# Patient Record
Sex: Male | Born: 1960
Health system: Southern US, Community
[De-identification: ages and names within clinical notes are randomized; demographics above are authoritative.]

## PROBLEM LIST (undated history)

## (undated) DIAGNOSIS — I10 Essential (primary) hypertension: Secondary | ICD-10-CM

## (undated) DIAGNOSIS — K5790 Diverticulosis of intestine, part unspecified, without perforation or abscess without bleeding: Secondary | ICD-10-CM

## (undated) DIAGNOSIS — E785 Hyperlipidemia, unspecified: Secondary | ICD-10-CM

## (undated) DIAGNOSIS — K219 Gastro-esophageal reflux disease without esophagitis: Secondary | ICD-10-CM

## (undated) DIAGNOSIS — I2510 Atherosclerotic heart disease of native coronary artery without angina pectoris: Secondary | ICD-10-CM

## (undated) DIAGNOSIS — I214 Non-ST elevation (NSTEMI) myocardial infarction: Secondary | ICD-10-CM

## (undated) DIAGNOSIS — F17201 Nicotine dependence, unspecified, in remission: Secondary | ICD-10-CM

## (undated) HISTORY — DX: Essential (primary) hypertension: I10

## (undated) HISTORY — DX: Nicotine dependence, unspecified, in remission: F17.201

## (undated) HISTORY — DX: Atherosclerotic heart disease of native coronary artery without angina pectoris: I25.10

## (undated) HISTORY — DX: Hyperlipidemia, unspecified: E78.5

## (undated) HISTORY — PX: HEMORROIDECTOMY: SUR656

## (undated) HISTORY — DX: Non-ST elevation (NSTEMI) myocardial infarction: I21.4

## (undated) HISTORY — DX: Gastro-esophageal reflux disease without esophagitis: K21.9

---

## 1998-02-22 ENCOUNTER — Emergency Department (HOSPITAL_COMMUNITY): Admission: EM | Admit: 1998-02-22 | Discharge: 1998-02-22 | Payer: Self-pay | Admitting: Emergency Medicine

## 2010-09-07 HISTORY — PX: COLONOSCOPY: SHX174

## 2011-02-01 ENCOUNTER — Emergency Department (HOSPITAL_COMMUNITY): Payer: BC Managed Care – PPO

## 2011-02-01 ENCOUNTER — Inpatient Hospital Stay (HOSPITAL_COMMUNITY)
Admission: EM | Admit: 2011-02-01 | Discharge: 2011-02-05 | DRG: 808 | Disposition: A | Discharge status: Home / Self Care | Payer: BC Managed Care – PPO | Attending: Cardiology | Admitting: Cardiology

## 2011-02-01 DIAGNOSIS — I214 Non-ST elevation (NSTEMI) myocardial infarction: Principal | ICD-10-CM | POA: Diagnosis present

## 2011-02-01 DIAGNOSIS — I2510 Atherosclerotic heart disease of native coronary artery without angina pectoris: Secondary | ICD-10-CM | POA: Diagnosis present

## 2011-02-01 DIAGNOSIS — E785 Hyperlipidemia, unspecified: Secondary | ICD-10-CM | POA: Diagnosis present

## 2011-02-01 DIAGNOSIS — K219 Gastro-esophageal reflux disease without esophagitis: Secondary | ICD-10-CM | POA: Diagnosis present

## 2011-02-01 DIAGNOSIS — Z7902 Long term (current) use of antithrombotics/antiplatelets: Secondary | ICD-10-CM

## 2011-02-01 DIAGNOSIS — I2582 Chronic total occlusion of coronary artery: Secondary | ICD-10-CM | POA: Diagnosis present

## 2011-02-01 DIAGNOSIS — Z7982 Long term (current) use of aspirin: Secondary | ICD-10-CM

## 2011-02-01 DIAGNOSIS — I10 Essential (primary) hypertension: Secondary | ICD-10-CM | POA: Diagnosis present

## 2011-02-01 DIAGNOSIS — E7800 Pure hypercholesterolemia, unspecified: Secondary | ICD-10-CM | POA: Diagnosis present

## 2011-02-01 DIAGNOSIS — F17200 Nicotine dependence, unspecified, uncomplicated: Secondary | ICD-10-CM | POA: Diagnosis present

## 2011-02-01 DIAGNOSIS — F1210 Cannabis abuse, uncomplicated: Secondary | ICD-10-CM | POA: Diagnosis present

## 2011-02-01 LAB — COMPREHENSIVE METABOLIC PANEL
ALT: 41 U/L (ref 0–53)
Alkaline Phosphatase: 72 U/L (ref 39–117)
BUN: 8 mg/dL (ref 6–23)
CO2: 25 mEq/L (ref 19–32)
Chloride: 100 mEq/L (ref 96–112)
GFR calc non Af Amer: >60 mL/min (ref >60)
Glucose, Bld: 112 mg/dL — ABNORMAL HIGH (ref 70–99)
Potassium: 4.3 mEq/L (ref 3.5–5.1)
Sodium: 136 mEq/L (ref 135–145)
Total Bilirubin: 0.2 mg/dL — ABNORMAL LOW (ref 0.3–1.2)
Total Protein: 7 g/dL (ref 6.0–8.3)

## 2011-02-01 LAB — CBC
HCT: 45.5 % (ref 39.0–52.0)
Hemoglobin: 15.7 g/dL (ref 13.0–17.0)
MCH: 30 pg (ref 26.0–34.0)
MCV: 87 fL (ref 78.0–100.0)
RBC: 5.23 MIL/uL (ref 4.22–5.81)
WBC: 12.7 10*3/uL — ABNORMAL HIGH (ref 4.0–10.5)

## 2011-02-01 LAB — CK TOTAL AND CKMB (NOT AT ARMC): Total CK: 1151 U/L — ABNORMAL HIGH (ref 7–232)

## 2011-02-01 LAB — TSH: TSH: 1.838 u[IU]/mL (ref 0.350–4.500)

## 2011-02-01 LAB — TROPONIN I: Troponin I: 10.71 ng/mL (ref <0.30)

## 2011-02-01 LAB — MRSA PCR SCREENING: MRSA by PCR: NEGATIVE

## 2011-02-01 LAB — PROTIME-INR: Prothrombin Time: 13.8 seconds (ref 11.6–15.2)

## 2011-02-01 LAB — CK: Total CK: 785 U/L — ABNORMAL HIGH (ref 7–232)

## 2011-02-01 LAB — CARDIAC PANEL(CRET KIN+CKTOT+MB+TROPI): CK, MB: 117.6 ng/mL (ref 0.3–4.0)

## 2011-02-01 LAB — HEMOGLOBIN A1C: Mean Plasma Glucose: 111 mg/dL (ref <117)

## 2011-02-02 LAB — CARDIAC PANEL(CRET KIN+CKTOT+MB+TROPI)
CK, MB: 81.9 ng/mL (ref 0.3–4.0)
Relative Index: 7.8 — ABNORMAL HIGH (ref 0.0–2.5)
Troponin I: 21.12 ng/mL (ref <0.30)

## 2011-02-02 LAB — CBC
HCT: 40.8 % (ref 39.0–52.0)
HCT: 41.3 % (ref 39.0–52.0)
Hemoglobin: 14.1 g/dL (ref 13.0–17.0)
MCH: 30.3 pg (ref 26.0–34.0)
MCV: 87.1 fL (ref 78.0–100.0)
RBC: 4.65 MIL/uL (ref 4.22–5.81)
RBC: 4.74 MIL/uL (ref 4.22–5.81)
WBC: 12.6 10*3/uL — ABNORMAL HIGH (ref 4.0–10.5)

## 2011-02-02 LAB — LIPID PANEL: LDL Cholesterol: 118 mg/dL — ABNORMAL HIGH (ref 0–99)

## 2011-02-02 LAB — HEPARIN LEVEL (UNFRACTIONATED): Heparin Unfractionated: 0.29 IU/mL — ABNORMAL LOW (ref 0.30–0.70)

## 2011-02-03 DIAGNOSIS — I2510 Atherosclerotic heart disease of native coronary artery without angina pectoris: Secondary | ICD-10-CM

## 2011-02-03 HISTORY — PX: CORONARY ANGIOPLASTY WITH STENT PLACEMENT: SHX49

## 2011-02-03 HISTORY — PX: CARDIAC CATHETERIZATION: SHX172

## 2011-02-03 LAB — CBC
HCT: 39.9 % (ref 39.0–52.0)
Hemoglobin: 13.6 g/dL (ref 13.0–17.0)
MCH: 30 pg (ref 26.0–34.0)
MCV: 88.1 fL (ref 78.0–100.0)
RBC: 4.53 MIL/uL (ref 4.22–5.81)

## 2011-02-03 LAB — BASIC METABOLIC PANEL
BUN: 10 mg/dL (ref 6–23)
GFR calc Af Amer: >60 mL/min (ref >60)
GFR calc non Af Amer: >60 mL/min (ref >60)
Potassium: 3.9 mEq/L (ref 3.5–5.1)
Sodium: 136 mEq/L (ref 135–145)

## 2011-02-03 LAB — CARDIAC PANEL(CRET KIN+CKTOT+MB+TROPI)
CK, MB: 10.2 ng/mL (ref 0.3–4.0)
Relative Index: 3.7 — ABNORMAL HIGH (ref 0.0–2.5)

## 2011-02-04 LAB — CBC
MCH: 30.3 pg (ref 26.0–34.0)
MCHC: 34.6 g/dL (ref 30.0–36.0)
MCV: 87.5 fL (ref 78.0–100.0)
Platelets: 296 10*3/uL (ref 150–400)
RBC: 5.05 MIL/uL (ref 4.22–5.81)
RDW: 13 % (ref 11.5–15.5)

## 2011-02-04 LAB — BASIC METABOLIC PANEL
BUN: 9 mg/dL (ref 6–23)
Creatinine, Ser: 0.96 mg/dL (ref 0.4–1.5)
GFR calc non Af Amer: >60 mL/min (ref >60)
Glucose, Bld: 95 mg/dL (ref 70–99)
Potassium: 4 mEq/L (ref 3.5–5.1)

## 2011-02-04 LAB — HEPARIN LEVEL (UNFRACTIONATED): Heparin Unfractionated: 0.18 IU/mL — ABNORMAL LOW (ref 0.30–0.70)

## 2011-02-05 LAB — BASIC METABOLIC PANEL
BUN: 14 mg/dL (ref 6–23)
CO2: 27 mEq/L (ref 19–32)
Calcium: 9.8 mg/dL (ref 8.4–10.5)
Chloride: 99 mEq/L (ref 96–112)
Creatinine, Ser: 1.02 mg/dL (ref 0.4–1.5)

## 2011-02-05 LAB — HEPARIN LEVEL (UNFRACTIONATED): Heparin Unfractionated: <0.1 IU/mL — ABNORMAL LOW (ref 0.30–0.70)

## 2011-02-05 NOTE — H&P (Signed)
NAME:  Robert Dyer, Robert Dyer NO.:  [id number]  MEDICAL RECORD NO.:  [id number]           PATIENT TYPE:  I  LOCATION:  2920                         FACILITY:  [facility]  PHYSICIAN:  Cassell Clement, M.D. DATE OF BIRTH:  09/11/1960  DATE OF ADMISSION:  02/01/2011 DATE OF DISCHARGE:                             HISTORY & PHYSICAL   PRIMARY CARDIOLOGIST:  Cassell Clement, MD  PRIMARY CARE PHYSICIAN:  Dr. Sharyn Lull at [facility].  REASON FOR ADMISSION:  Non-ST elevated MI.  HISTORY OF PRESENT ILLNESS:  This is a 50 year old Caucasian male without prior cardiac history with complaints of midsternal chest pain starting 3 days ago which he described as indigestion and tightness lasting 3 hours, coming and going.  He took Tums and the pain was relieved.  The pain reoccurred with exertion, replacing a boat motor pump.  The following day, he had recurrence of chest pain after eating, lying down.  He took some Tums again, lasting on and off all night long from about 10:00 p.m. to 7:00 a.m. and then went away on its own.  Again last night prior to admission, he had recurrence of chest pain after eating.  He again felt it was indigestion.  The symptom was associated with eating food.  He states that the pain was more intense and was constant with no associated nausea, shortness breath, or diaphoresis during any chest pain occurrences.  He took Tums, which did not help.  Since the pain was recurrent, worsening, and did not go away, he came to the emergency room where he was treated with aspirin, nitroglycerin, and cardiac enzymes were cycled.  Initial cardiac enzyme was found to be positive with a troponin of 2.93 with a CK of 785.  He was subsequently started on heparin drip and nitroglycerin drip and is resting comfortably.  He was given a low dose of morphine and is without complaint at this time.  As a result of this, we are asked to see him. The patient did have  an EKG.  It did show some anterolateral Q-waves noted, which uncertain of age.  The patient has multiple cardiovascular risk factors.  REVIEW OF SYSTEMS:  Chest pain, indigestion progressive, nonradiating with no associated shortness of breath, dizziness, nausea, vomiting, or diaphoresis.  All other systems are reviewed and found to be negative. Code status is full.  PAST MEDICAL HISTORY:  GERD, hypertension, hypercholesterolemia. A.  Allergic to statins causing hives.  PAST SURGICAL HISTORY:  Hemorrhoidectomy, repair of nose fracture, hand and leg fractures.  SOCIAL HISTORY:  He lives in [place] with his wife.  He works at [employer].  He is married, is a 25 pack-year smoker, occasional EtOH, occasionally use marijuana.  FAMILY HISTORY:  Mother deceased from MI at age 13.  Father deceased from old days with liver disease and kidney disease.  He has 1 sister, who has had an MI and some blood clots in the past.  CURRENT MEDICATIONS:  Prior to admission, 1. Lisinopril 5 mg daily. 2. Goody's Powder p.r.n.  ALLERGIES:  TO STATINS CAUSING HIVES.  LABORATORY DATA:  Current labs, sodium 136,  potassium 4.3, chloride 100, CO2 of 25, BUN 8, creatinine 1.01, glucose 112.  Hemoglobin 15.7, hematocrit 45.5, white blood cells 12.7, platelets 293.  Total bili 0.2, alkaline phosphatase 72, AST 56, ALT 41, total protein 7.0, albumin 4.0, calcium 9.7.  Troponin 2.93.  EKG revealing normal sinus rhythm with anterolateral Q-waves noted.  EKG showing a rate of 76 beats per minute.  Chest x-ray revealing bibasilar atelectasis.  No other acute findings.  PHYSICAL EXAM:  VITAL SIGNS:  Blood pressure 122/56, pulse 60, respirations 17, O2 sat 100% on 2 liters. GENERAL:  He is awake, alert and oriented, no acute distress. HEENT:  Head is normocephalic and atraumatic.  Eyes, PERRLA. NECK:  Supple without thyromegaly, bruit, or JVD. CARDIOVASCULAR:  Regular rate and rhythm with 1/6 systolic  murmur. Pulses are 2+ and equal without bruits. LUNGS:  Bibasilar crackles to the middle lobes with wheezing. ABDOMEN:  Soft, obese, nontender with 2+ bowel sounds. EXTREMITIES:  Without clubbing, cyanosis, or edema. MUSCULOSKELETAL:  No joint deformity or effusions. NEURO:  Cranial nerves II through XII are grossly intact.  IMPRESSION: 1. Non-ST elevated myocardial infarction.  The patient will be     admitted to step-down.  Continue heparin, nitroglycerin, and cycle     cardiac enzymes.  Planned cardiac catheterization on Tuesday.     Discussed need for cardiac catheterization risks and benefits and     he is willing to proceed.  We will start him on a low-dose beta-     blocker 12.5 mg b.i.d.  He will also be started on Xopenex     breathing treatments.  The patient is intolerant to statins, but we     will start him on Zetia 10 mg daily and monitor his progress. 2. Hypertension, currently controlled on lisinopril. 3. Tobacco abuse, immediate cessation.  We will give him a nicotine     patch.  The patient has been seen and examined by myself and Dr. Patty Sermons in the emergency room.  He has multiple cardiovascular risk factors including smoking, family history, and elevated lipids which are untreated secondary to intolerant of statins.  EKG shows Q-waves in V2 with no acute ST elevation.  The patient is planned for catheterization as discussed above and we will make further recommendations throughout hospital course depending upon the patient's response to treatment and cath results.    Bettey Mare. Lyman Bishop, NP   ______________________________ Cassell Clement, M.D.   KML/MEDQ  D:  02/01/2011  T:  02/01/2011  Job:  [id number]  cc:   Dr. Sharyn Lull at [facility]  Electronically Signed by Joni Reining NP on 02/02/2011 09:12:50 PM Electronically Signed by Cassell Clement M.D. on 02/05/2011 12:48:39 PM

## 2011-02-09 ENCOUNTER — Telehealth: Payer: Self-pay | Admitting: Cardiology

## 2011-02-09 NOTE — Telephone Encounter (Signed)
WANTS TO GET SCRIPT FOR PROTONIX   IT WAS NOT IN WITH THE OTHERS AND WANTS TO SEE IF HE CAN INCREASE THE Prudy Feeler

## 2011-02-10 ENCOUNTER — Other Ambulatory Visit: Payer: Self-pay | Admitting: Cardiology

## 2011-02-10 DIAGNOSIS — K30 Functional dyspepsia: Secondary | ICD-10-CM

## 2011-02-10 NOTE — Telephone Encounter (Signed)
Pt called back to give correct phone number

## 2011-02-10 NOTE — Telephone Encounter (Signed)
Left message

## 2011-02-10 NOTE — Telephone Encounter (Signed)
Tried to find chart on patient yesterday, only seen in hospital.  Will document on open telephone encounter from today

## 2011-02-11 MED ORDER — PANTOPRAZOLE SODIUM 40 MG PO TBEC: 40.0000 mg | DELAYED_RELEASE_TABLET | Freq: Every day | ORAL | Status: DC

## 2011-02-11 MED ORDER — ALPRAZOLAM 0.25 MG PO TABS: 0.2500 mg | ORAL_TABLET | Freq: Three times a day (TID) | ORAL | Status: DC | PRN

## 2011-02-11 NOTE — Telephone Encounter (Signed)
Patient phoned wanting to change from prilosec to protonix, called to [facility].  Also stated he is very anxious and would like to increase his xanax 0.25.  Increased from twice a day to three times a day as needed per Dr. Patty Sermons

## 2011-02-11 NOTE — Cardiovascular Report (Signed)
NAME:  Robert Dyer, Robert Dyer NO.:  [id number]  MEDICAL RECORD NO.:  [id number]           PATIENT TYPE:  I  LOCATION:  6525                         FACILITY:  [facility]  PHYSICIAN:  Kaelob Persky M. [place], M.D.  DATE OF BIRTH:  04-Jan-1961  DATE OF PROCEDURE:  02/03/2011 DATE OF DISCHARGE:                           CARDIAC CATHETERIZATION   INDICATIONS FOR PROCEDURE:  A 50 year old white male, who presented late in the course of a lateral infarction.  He has a history of hypertension and hypercholesterolemia.  PROCEDURES:  Left heart catheterization, coronary and left ventricular angiography, access via the right radial artery using standard Seldinger technique.  EQUIPMENTS: 1. A 5-French 4-cm right Judkins catheter. 2. A 5-French 3.5-cm left Judkins catheter. 3. A 5-French pigtail catheter. 4. A 5-French arterial sheath. 5. A 6-French arterial sheath. 6. A 6-French XB LAD. 7. A 3.5 guide Prowater wire. 8. A 2.0 x 12-mm apex balloon. 9. A 2.5 x 10-mm Flextome cutting balloon.  MEDICATIONS: 1. Local anesthesia with 1% Xylocaine. 2. Versed 2 mg IV. 3. Fentanyl 25 mcg IV. 4. Verapamil 3 mg intra-arterial. 5. Heparin bolus of 4000 units. 6. Angiomax bolus of 0.75 mg/kg followed by continuous infusion of     1.75 mg/kg per hour. 7. Effient 60 mg p.o. 8. Nitroglycerin 200 mcg intracoronary x1. 9. Contrast 150 mL of Omnipaque.  HEMODYNAMIC DATA:  Aortic pressure is 126/80 with a mean of 99 mmHg, left ventricular pressure is 126 with EDP of 22 mmHg.  ANGIOGRAPHIC DATA:  The right coronary artery is a small nondominant vessel and appears normal.  The left main coronary artery is very short and appears normal.  The left anterior descending artery has 20-30% disease in the proximal vessel and then extends to the apex without significant disease.  The first diagonal branch has a 100% occlusion of the midvessel.  The ramus intermediate branch has a 99% ostial  stenosis.  The left circumflex coronary artery is a large dominant vessel that gives rise to several marginal vessels before terminating in the PDA. He has diffuse irregularities less than or equal to 20%.  The left PDA has a 60%-70% stenosis.  Left ventricular angiography performed in the RAO view demonstrates distal anterolateral akinesis with overall ejection fraction of 45%.  We proceeded with percutaneous intervention of the intermediate branch. It was felt that the diagonal branch was his infarct vessel, but that this had been occluded for sometime and was not likely to benefit with reperfusion.  After anticoagulation, we were able to cross the intermediate with a Prowater wire.  We predilated the lesion with a 2.0 x 12-mm apex balloon to 10 atmospheres.  We then placed a 2.5 x 10-mm cutting balloon and performed 2 inflations to 6 atmospheres.  This yielded an excellent angiographic result with 0% residual stenosis and TIMI grade 3 flow.  FINAL INTERPRETATION: 1. Two-vessel obstructive atherosclerotic coronary artery disease. His infarct vessel was the first diagonal branch which is occluded     in the midvessel. 2. Mild left ventricular dysfunction. 3. Successful cutting balloon angioplasty of the ramus intermediate     branch.  PLAN:  I would continue on aspirin and Plavix given his recent infarct.          ______________________________ Ezequiel Macauley M. [place], M.D.     PMJ/MEDQ  D:  02/03/2011  T:  02/04/2011  Job:  [id number]  cc:   Cassell Clement, M.D.  Electronically Signed by Takyah Ciaramitaro [place] M.D. on 02/11/2011 06:07:04 PM

## 2011-02-12 NOTE — Discharge Summary (Signed)
NAME:  Robert Dyer, Robert Dyer NO.:  [id number]  MEDICAL RECORD NO.:  [id number]           PATIENT TYPE:  I  LOCATION:  6525                         FACILITY:  [facility]  PHYSICIAN:  Cassell Clement, M.D. DATE OF BIRTH:  [birth year]  DATE OF ADMISSION:  02/01/2011 DATE OF DISCHARGE:  02/05/2011                              DISCHARGE SUMMARY   PRIMARY CARDIOLOGIST:  Cassell Clement, MD  PRIMARY CARE PROVIDER:  Eduardo Osier. Harwani, MD  DISCHARGE DIAGNOSIS:  Non-ST-segment elevation myocardial infarction.  SECONDARY DIAGNOSES: 1. Coronary artery disease, status post cutting balloon angioplasty of     the ramus intermedius this admission. 2. Hypertension. 3. Hyperlipidemia. 4. Gastroesophageal reflux disease. 5. Tobacco abuse. 6. Status post hemorrhoidectomy. 7. History of nasal fracture, status post repair. 8. History of hand and leg fractures.  ALLERGIES:  SIMVASTATIN causes hives.  PROCEDURES:  Left heart cardiac catheterization performed on Feb 03, 2011, revealing a total occlusion of the mid first diagonal with a 99% ostial stenosis of the ramus intermedius, and otherwise nonobstructive disease.  EF was 45% with distal anterolateral akinesis.  The first diagonal was felt to be the infarct vessel that was felt that it was likely occluded for some time and was not likely to benefit with reperfusion.  Therefore, the ramus intermedius was intervened upon with cutting balloon angioplasty only.  HISTORY OF PRESENT ILLNESS:  A 50 year old male without prior history of coronary artery disease was in his usual state of health approximately 3 days prior to admission when he began to experience intermittent chest tightness.  It has been indigestion-like symptoms, relieved with over- the-counter antacids.  On the night prior to admission, the patient had recurrent chest discomfort after eating.  This was more intense and constant though he had no associated symptoms.   Because of progressive symptoms or persistence of symptoms throughout the night, he presented to the [facility] ED on Feb 01, 2011, where he was noted to have an elevated CK of 785 with a troponin of 2.93.  He was placed on heparin and nitroglycerin infusion with improvement in symptoms.  ECG showed anterolateral Qs, and the patient was admitted for management of out-of- hospital non-ST-elevation MI.  HOSPITAL COURSE:  The patient eventually peaked his CK at 1235, MB at 126.6, and troponin I at 21.12.  The patient did have chest discomfort following admission, and this was treated with titration of IV nitroglycerin with resolution of discomfort.  He underwent diagnostic catheterization on Feb 03, 2011, which revealed a total occlusion of the mid first diagonal.  This was felt to be the infarct vessel.  This was felt to also represent a chronic total occlusion, and thus was not intervened upon.  The patient also had a 99% ostial stenosis in the ramus intermedius, and this was successfully treated with cuttingballoon angioplasty only with good restoration of distal flow.  This was a 2.5 mm vessel.  The patient tolerated this procedure well; and postprocedure, has not had any recurrent chest discomfort.  He has been maintained on aspirin and Plavix along with beta-blocker, ACE inhibitor, and Crestor therapy (the patient was willing to try  Crestor despite history of hives with simvastatin).  He has been seen by Cardiac Rehab and counseled on the importance of smoking cessation.  He will be discharged home today in good condition.  DISCHARGE LABORATORIES:  Hemoglobin 15.3, hematocrit 44.2, WBC 11.6, platelets 296.  Sodium 136, potassium 4.1, chloride 99, CO2 of 27, BUN 14, creatinine 1.02, glucose 103.  Total bilirubin 0.2, alkaline phosphatase 72, AST 56, ALT 41, total protein 7.0, albumin 4.0, calcium 9.8.  Hemoglobin A1c 5.5.  CK 276, MB 10.2, troponin I 4.72.  Total cholesterol 189,  triglycerides 240, HDL 23, LDL 118.  TSH 1.838.  MRSA screen was negative.  DISPOSITION:  The patient will be discharged home today in good condition.  FOLLOWUP PLANS AND APPOINTMENTS:  The patient will follow up with Norma Fredrickson, nurse practitioner, at [facility] on February 23, 2011, at 2:15 p.m.  He will follow up with the primary care provider as previously scheduled.  DISCHARGE MEDICATIONS: 1. Alprazolam 0.25 mg q.12 h. p.r.n. 2. Aspirin 81 mg daily. 3. Plavix 75 mg daily. 4. Metoprolol 25 mg half tablet b.i.d. 5. Nitroglycerin 0.4 mg sublingual p.r.n. chest pain. 6. Nicotine patch 21 mg daily x6 weeks, then 14 mg daily x2 weeks, and     7 mg daily x2 weeks. 7. Rosuvastatin 10 mg daily. 8. Lisinopril 10 mg daily. 9. Omeprazole 10 mg daily.  OUTSTANDING LABORATORY STUDIES:  Followup ECG at return visit.  Followup lipids and LFTs in 6-8 weeks.  DURATION OF DISCHARGE ENCOUNTER:  60 minutes including physician time.     Nicolasa Ducking, ANP   ______________________________ Cassell Clement, M.D.    CB/MEDQ  D:  02/05/2011  T:  02/06/2011  Job:  [id number]  cc:   Eduardo Osier. Sharyn Lull, M.D.  Electronically Signed by Nicolasa Ducking ANP on 02/10/2011 08:08:11 PM Electronically Signed by Cassell Clement M.D. on 02/12/2011 08:29:53 AM

## 2011-02-18 ENCOUNTER — Encounter: Payer: Self-pay | Admitting: Nurse Practitioner

## 2011-02-19 ENCOUNTER — Telehealth: Payer: Self-pay | Admitting: Cardiology

## 2011-02-19 NOTE — Telephone Encounter (Signed)
Pt called and said he dropped leave paperwork for his job last week and hasnt heard anything please call

## 2011-02-19 NOTE — Telephone Encounter (Signed)
Spoke with patient and advised forms ready to be picked up

## 2011-02-23 ENCOUNTER — Encounter: Payer: Self-pay | Admitting: Cardiology

## 2011-02-23 ENCOUNTER — Ambulatory Visit (INDEPENDENT_AMBULATORY_CARE_PROVIDER_SITE_OTHER): Payer: BC Managed Care – PPO | Admitting: Nurse Practitioner

## 2011-02-23 ENCOUNTER — Other Ambulatory Visit: Payer: Self-pay | Admitting: *Deleted

## 2011-02-23 ENCOUNTER — Encounter: Payer: Self-pay | Admitting: Nurse Practitioner

## 2011-02-23 VITALS — BP 100/70 | HR 78 | Wt 211.0 lb

## 2011-02-23 DIAGNOSIS — F17200 Nicotine dependence, unspecified, uncomplicated: Secondary | ICD-10-CM

## 2011-02-23 DIAGNOSIS — I214 Non-ST elevation (NSTEMI) myocardial infarction: Secondary | ICD-10-CM

## 2011-02-23 DIAGNOSIS — I519 Heart disease, unspecified: Secondary | ICD-10-CM

## 2011-02-23 DIAGNOSIS — Z720 Tobacco use: Secondary | ICD-10-CM

## 2011-02-23 DIAGNOSIS — K30 Functional dyspepsia: Secondary | ICD-10-CM

## 2011-02-23 DIAGNOSIS — E785 Hyperlipidemia, unspecified: Secondary | ICD-10-CM | POA: Insufficient documentation

## 2011-02-23 MED ORDER — PANTOPRAZOLE SODIUM 40 MG PO TBEC: 40.0000 mg | DELAYED_RELEASE_TABLET | Freq: Every day | ORAL | Status: DC

## 2011-02-23 MED ORDER — LISINOPRIL 10 MG PO TABS: 10.0000 mg | ORAL_TABLET | Freq: Every day | ORAL | Status: DC

## 2011-02-23 MED ORDER — CLOPIDOGREL BISULFATE 75 MG PO TABS: 75.0000 mg | ORAL_TABLET | Freq: Every day | ORAL | Status: DC

## 2011-02-23 MED ORDER — ROSUVASTATIN CALCIUM 10 MG PO TABS: 10.0000 mg | ORAL_TABLET | Freq: Every day | ORAL | Status: DC

## 2011-02-23 MED ORDER — NITROGLYCERIN 0.4 MG SL SUBL: 0.4000 mg | SUBLINGUAL_TABLET | SUBLINGUAL | Status: DC | PRN

## 2011-02-23 NOTE — Assessment & Plan Note (Signed)
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

## 2011-02-23 NOTE — Assessment & Plan Note (Signed)
He is on Crestor. We will check fasting labs on return.

## 2011-02-23 NOTE — Patient Instructions (Signed)
I want to see you in a month. We will do lab work at that time, fasting. You may return to work on July 1st. Half days x 2 weeks then you may resume your full time schedule Continue with your walking Congratulations for stopping smoking.

## 2011-02-23 NOTE — Progress Notes (Signed)
Robert Dyer Date of Birth: [birth year]   History of Present Illness: Robert Dyer is seen back today for a post hospital visit. He is seen for Dr. Patty Sermons. He has had a NSTEMI at the end of May. His diagonal vessel was felt to be occluded chronically, despite being the infarct vessel. He has cutting balloon PTCA to the ramus. He did have LV dysfunction. EF was 45%. He is on Plavix. He is doing well. He has no further chest pain. He is anxious to return to work. He is walking 2 times a day without any trouble. He will not be able to afford cardiac rehab. He is tolerating his medicines. He has no real complaint. He is tolerating his medicines. He has had a history of hives with Zocor but is doing fine on the Crestor. He is not smoking.   Current Outpatient Prescriptions on File Prior to Visit  Medication Sig Dispense Refill  . ALPRAZolam (XANAX) 0.25 MG tablet Take 1 tablet (0.25 mg total) by mouth 3 (three) times daily as needed.  90 tablet  0  . aspirin 81 MG tablet Take 81 mg by mouth daily.        . clopidogrel (PLAVIX) 75 MG tablet Take 75 mg by mouth daily.        Marland Kitchen lisinopril (PRINIVIL,ZESTRIL) 10 MG tablet Take 10 mg by mouth daily.        . metoprolol tartrate (LOPRESSOR) 25 MG tablet Take 12.5 mg by mouth 2 (two) times daily.        . nitroGLYCERIN (NITROSTAT) 0.4 MG SL tablet Place 0.4 mg under the tongue every 5 (five) minutes as needed.        . pantoprazole (PROTONIX) 40 MG tablet Take 1 tablet (40 mg total) by mouth daily.  30 tablet  11  . rosuvastatin (CRESTOR) 10 MG tablet Take 10 mg by mouth daily.        Marland Kitchen DISCONTD: nicotine (NICODERM CQ - DOSED IN MG/24 HOURS) 21 mg/24hr patch Place 1 patch onto the skin as directed.        Marland Kitchen DISCONTD: omeprazole (PRILOSEC) 10 MG capsule Take 10 mg by mouth daily.          Allergies  Allergen Reactions  . Statins     Past Medical History  Diagnosis Date  . MI, acute, non ST segment elevation     s/p cutting balloon PTCA to the  ramus intermedius  . GERD (gastroesophageal reflux disease)   . Hyperlipidemia   . Hypertension   . Coronary artery disease     Past Surgical History  Procedure Date  . Hemorroidectomy   . Cardiac catheterization 02/03/2011    revealing a total occlusion of the mid first diagonal with a 99% ostial stenosis of the ramus intermedius, and otherwise nonobstructive  disease.  EF was 45% with distal anterolateral akinesis.  The first  diagonal was felt to be the infarct vessel that was felt that it was  likely occluded for some time and was not likely to benefit with  reperfusion.    . Coronary angioplasty 02/03/2011    s/p cutting balloon PTCA to the ramus intermediate    History  Smoking status  . Former Smoker  . Types: Cigarettes  Smokeless tobacco  . Not on file  Comment: quit 2011    History  Alcohol Use  . Yes    Family History  Problem Relation Age of Onset  . Heart attack Mother   .  Liver disease Father   . Kidney disease Father     Review of Systems: The review of systems is as above. He is not lightheaded or dizzy. No problems with his cath site (right radial).  All other systems were reviewed and are negative.  Physical Exam: BP 100/70  Pulse 78  Wt 211 lb (95.709 kg) Patient is pleasant and in no acute distress. Skin is warm and dry. Color is normal.  HEENT is unremarkable. Normocephalic/atraumatic. PERRL. Sclera are nonicteric. Neck is supple. No masses. No JVD. Lungs are clear. Cardiac exam shows a regular rate and rhythm. Abdomen is soft. Extremities are without edema. Gait and ROM are intact. No gross neurologic deficits noted.  LABORATORY DATA: N/A   Assessment / Plan:

## 2011-02-23 NOTE — Telephone Encounter (Signed)
Refilledplavix,nitrostat.lisinopril, generic crestor  By fax to [facility]

## 2011-02-23 NOTE — Assessment & Plan Note (Signed)
He will need a follow up echo once we are 3 months post MI. He is currently asymptomatic.

## 2011-02-24 ENCOUNTER — Telehealth: Payer: Self-pay | Admitting: Cardiology

## 2011-02-24 NOTE — Telephone Encounter (Signed)
Calling in regard to paperwork to return to work. Patient wants to discuss this with you.

## 2011-02-25 ENCOUNTER — Telehealth: Payer: Self-pay | Admitting: Cardiology

## 2011-02-25 NOTE — Telephone Encounter (Signed)
PT SAID RETURNING MELINDA'S PHONE CALL. NO CHART/EPIC ONLY.

## 2011-02-25 NOTE — Telephone Encounter (Signed)
Ok to return to work per [employer] 100% on July 9.  Advised patient

## 2011-02-27 NOTE — Telephone Encounter (Signed)
Have spoken with patient several times over the last few days.  Lawson Fiscal has okd him to go back to work on 7/16.  Note to be signed by Dr Barrett Shell.

## 2011-03-03 ENCOUNTER — Encounter: Payer: Self-pay | Admitting: Cardiology

## 2011-03-23 ENCOUNTER — Encounter: Payer: Self-pay | Admitting: Nurse Practitioner

## 2011-03-23 ENCOUNTER — Telehealth: Payer: Self-pay | Admitting: *Deleted

## 2011-03-23 ENCOUNTER — Other Ambulatory Visit (INDEPENDENT_AMBULATORY_CARE_PROVIDER_SITE_OTHER): Payer: BC Managed Care – PPO | Admitting: *Deleted

## 2011-03-23 ENCOUNTER — Ambulatory Visit (INDEPENDENT_AMBULATORY_CARE_PROVIDER_SITE_OTHER): Payer: BC Managed Care – PPO | Admitting: Nurse Practitioner

## 2011-03-23 VITALS — BP 112/76 | HR 64 | Ht 68.0 in | Wt 208.8 lb

## 2011-03-23 DIAGNOSIS — K3189 Other diseases of stomach and duodenum: Secondary | ICD-10-CM

## 2011-03-23 DIAGNOSIS — I519 Heart disease, unspecified: Secondary | ICD-10-CM

## 2011-03-23 DIAGNOSIS — R899 Unspecified abnormal finding in specimens from other organs, systems and tissues: Secondary | ICD-10-CM

## 2011-03-23 DIAGNOSIS — E785 Hyperlipidemia, unspecified: Secondary | ICD-10-CM

## 2011-03-23 DIAGNOSIS — R1013 Epigastric pain: Secondary | ICD-10-CM

## 2011-03-23 DIAGNOSIS — F17200 Nicotine dependence, unspecified, uncomplicated: Secondary | ICD-10-CM

## 2011-03-23 DIAGNOSIS — I214 Non-ST elevation (NSTEMI) myocardial infarction: Secondary | ICD-10-CM

## 2011-03-23 DIAGNOSIS — K30 Functional dyspepsia: Secondary | ICD-10-CM

## 2011-03-23 DIAGNOSIS — Z720 Tobacco use: Secondary | ICD-10-CM

## 2011-03-23 LAB — BASIC METABOLIC PANEL
BUN: 12 mg/dL (ref 6–23)
CO2: 27 mEq/L (ref 19–32)
Calcium: 9.6 mg/dL (ref 8.4–10.5)
Chloride: 99 mEq/L (ref 96–112)
Creatinine, Ser: 1 mg/dL (ref 0.4–1.5)
GFR: 82.84 mL/min (ref >60.00)
Glucose, Bld: 110 mg/dL — ABNORMAL HIGH (ref 70–99)
Potassium: 5 mEq/L (ref 3.5–5.1)
Sodium: 136 mEq/L (ref 135–145)

## 2011-03-23 LAB — HEPATIC FUNCTION PANEL
ALT: 75 U/L — ABNORMAL HIGH (ref 0–53)
AST: 33 U/L (ref 0–37)
Albumin: 4.7 g/dL (ref 3.5–5.2)
Alkaline Phosphatase: 53 U/L (ref 39–117)
Bilirubin, Direct: 0.1 mg/dL (ref 0.0–0.3)
Total Bilirubin: 0.5 mg/dL (ref 0.3–1.2)
Total Protein: 7.2 g/dL (ref 6.0–8.3)

## 2011-03-23 LAB — LIPID PANEL
Cholesterol: 143 mg/dL (ref 0–200)
HDL: 29.6 mg/dL — ABNORMAL LOW (ref >39.00)
LDL Cholesterol: 74 mg/dL (ref 0–99)
Total CHOL/HDL Ratio: 5
Triglycerides: 195 mg/dL — ABNORMAL HIGH (ref 0.0–149.0)
VLDL: 39 mg/dL (ref 0.0–40.0)

## 2011-03-23 MED ORDER — PANTOPRAZOLE SODIUM 40 MG PO TBEC: 40.0000 mg | DELAYED_RELEASE_TABLET | Freq: Every day | ORAL | Status: DC

## 2011-03-23 NOTE — Telephone Encounter (Signed)
Message copied by Eugenia Pancoast on Mon Mar 23, 2011  5:22 PM ------      Message from: Rosalio Macadamia      Created: Mon Mar 23, 2011  3:37 PM       Ok to report. Liver tests up slightly. Would like to recheck hepatic profile in 2 weeks. For now, same dose Crestor. Continue to work on diet and weight loss.

## 2011-03-23 NOTE — Assessment & Plan Note (Signed)
Would consider echo on return visit.

## 2011-03-23 NOTE — Assessment & Plan Note (Signed)
He is doing very well. He is ok to return to work with no restrictions. We will continue with his current medicines. I will have him see Dr. Patty Sermons in 3 months. Will consider echo on return visit. Patient is agreeable to this plan and will call if any problems develop in the interim.

## 2011-03-23 NOTE — Assessment & Plan Note (Signed)
He is not smoking. I have encouraged to remain off of his cigarettes.

## 2011-03-23 NOTE — Assessment & Plan Note (Signed)
Labs are checked today.  

## 2011-03-23 NOTE — Progress Notes (Signed)
Robert Dyer Date of Birth: 1961/08/31   History of Present Illness: Robert Dyer is seen back today for his one month check. He is doing well. He has no complaints. His feet are a little sore. He actually walked 10 miles a couple of days ago. No chest pain. No shortness of breath. He is tolerating his medicines. He is anxious to return to work. He had a NSTEMI at the end of May and was treated with cutting balloon PCI to the ramus. He had LV dysfunction with an EF of 45%. He is on Plavix. No CHF symptoms. He is doing well. He is not smoking.   Current Outpatient Prescriptions on File Prior to Visit  Medication Sig Dispense Refill  . aspirin 81 MG tablet Take 81 mg by mouth daily.        . clopidogrel (PLAVIX) 75 MG tablet Take 1 tablet (75 mg total) by mouth daily.  90 tablet  3  . lisinopril (PRINIVIL,ZESTRIL) 10 MG tablet Take 1 tablet (10 mg total) by mouth daily.  90 tablet  3  . metoprolol tartrate (LOPRESSOR) 25 MG tablet Take 12.5 mg by mouth 2 (two) times daily.        . nitroGLYCERIN (NITROSTAT) 0.4 MG SL tablet Place 1 tablet (0.4 mg total) under the tongue every 5 (five) minutes as needed for chest pain.  90 tablet  3  . rosuvastatin (CRESTOR) 10 MG tablet Take 1 tablet (10 mg total) by mouth daily.  90 tablet  3  . DISCONTD: pantoprazole (PROTONIX) 40 MG tablet Take 1 tablet (40 mg total) by mouth daily.  90 tablet  3    Allergies  Allergen Reactions  . Statins     Past Medical History  Diagnosis Date  . MI, acute, non ST segment elevation     s/p cutting balloon PTCA to the ramus intermedius  . GERD (gastroesophageal reflux disease)   . Hyperlipidemia   . Hypertension   . Coronary artery disease   . Tobacco abuse, in remission     Past Surgical History  Procedure Date  . Hemorroidectomy   . Cardiac catheterization 02/03/2011    revealing a total occlusion of the mid first diagonal with a 99% ostial stenosis of the ramus intermedius, and otherwise  nonobstructive  disease.  EF was 45% with distal anterolateral akinesis.  The first  diagonal was felt to be the infarct vessel that was felt that it was  likely occluded for some time and was not likely to benefit with  reperfusion.    . Coronary angioplasty 02/03/2011    s/p cutting balloon PTCA to the ramus intermediate    History  Smoking status  . Former Smoker  . Types: Cigarettes  Smokeless tobacco  . Not on file  Comment: quit 2011    History  Alcohol Use  . Yes    Family History  Problem Relation Age of Onset  . Heart attack Mother   . Liver disease Father   . Kidney disease Father     Review of Systems: The review of systems is as above.  All other systems were reviewed and are negative.  Physical Exam: BP 112/76  Pulse 64  Ht 5\' 8"  (1.727 m)  Wt 208 lb 12.8 oz (94.711 kg)  BMI 31.75 kg/m2 Patient is very pleasant and in no acute distress. Skin is warm and dry. Color is normal.  HEENT is unremarkable. Normocephalic/atraumatic. PERRL. Sclera are nonicteric. Neck is supple. No  masses. No JVD. Lungs are clear. Cardiac exam shows a regular rate and rhythm. Abdomen is soft. Extremities are without edema. Gait and ROM are intact. No gross neurologic deficits noted.  LABORATORY DATA: Pending   Assessment / Plan:

## 2011-03-23 NOTE — Patient Instructions (Signed)
Stay on your current medicines I congratulate you for staying off of your cigarettes We will see you back in about 3 months We will call you with your labs Exercise for 45 minutes each day, watch your diet and continue to work on your weight.

## 2011-03-23 NOTE — Telephone Encounter (Signed)
Advised of labs 

## 2011-03-23 NOTE — Telephone Encounter (Signed)
Advised patient of lab work 

## 2011-03-25 ENCOUNTER — Telehealth: Payer: Self-pay | Admitting: Cardiology

## 2011-03-25 ENCOUNTER — Encounter: Payer: Self-pay | Admitting: Cardiology

## 2011-03-25 NOTE — Telephone Encounter (Signed)
Called and advised not at this time

## 2011-03-25 NOTE — Telephone Encounter (Signed)
[employer] representative called to find out if we have faxed information to her regarding Dr.Brackbill's approval for patient to have genetic testing.  She said that she faxed the release/request the end of June.

## 2011-04-06 ENCOUNTER — Ambulatory Visit (INDEPENDENT_AMBULATORY_CARE_PROVIDER_SITE_OTHER): Payer: BC Managed Care – PPO | Admitting: *Deleted

## 2011-04-06 DIAGNOSIS — E785 Hyperlipidemia, unspecified: Secondary | ICD-10-CM

## 2011-04-06 DIAGNOSIS — R899 Unspecified abnormal finding in specimens from other organs, systems and tissues: Secondary | ICD-10-CM

## 2011-04-06 DIAGNOSIS — R6889 Other general symptoms and signs: Secondary | ICD-10-CM

## 2011-04-06 LAB — HEPATIC FUNCTION PANEL
ALT: 39 U/L (ref 0–53)
AST: 24 U/L (ref 0–37)
Albumin: 4.2 g/dL (ref 3.5–5.2)
Total Protein: 6.4 g/dL (ref 6.0–8.3)

## 2011-04-08 ENCOUNTER — Telehealth: Payer: Self-pay | Admitting: *Deleted

## 2011-04-08 NOTE — Telephone Encounter (Signed)
Advised of labs 

## 2011-04-28 ENCOUNTER — Other Ambulatory Visit: Payer: Self-pay | Admitting: *Deleted

## 2011-04-28 DIAGNOSIS — K30 Functional dyspepsia: Secondary | ICD-10-CM

## 2011-04-28 DIAGNOSIS — F419 Anxiety disorder, unspecified: Secondary | ICD-10-CM

## 2011-04-28 MED ORDER — ALPRAZOLAM 0.25 MG PO TABS: 0.2500 mg | ORAL_TABLET | Freq: Three times a day (TID) | ORAL | Status: AC | PRN

## 2011-04-28 NOTE — Telephone Encounter (Signed)
Refilled meds per fax request. Faxed signed Rx back 

## 2011-06-22 ENCOUNTER — Encounter: Payer: Self-pay | Admitting: Cardiology

## 2011-06-22 ENCOUNTER — Ambulatory Visit (INDEPENDENT_AMBULATORY_CARE_PROVIDER_SITE_OTHER): Payer: BC Managed Care – PPO | Admitting: Cardiology

## 2011-06-22 VITALS — BP 100/88 | HR 66 | Ht 70.0 in | Wt 203.0 lb

## 2011-06-22 DIAGNOSIS — I519 Heart disease, unspecified: Secondary | ICD-10-CM

## 2011-06-22 DIAGNOSIS — I214 Non-ST elevation (NSTEMI) myocardial infarction: Secondary | ICD-10-CM

## 2011-06-22 DIAGNOSIS — F17200 Nicotine dependence, unspecified, uncomplicated: Secondary | ICD-10-CM

## 2011-06-22 DIAGNOSIS — Z720 Tobacco use: Secondary | ICD-10-CM

## 2011-06-22 DIAGNOSIS — E785 Hyperlipidemia, unspecified: Secondary | ICD-10-CM

## 2011-06-22 LAB — LIPID PANEL: Cholesterol: 138 mg/dL (ref 0–200)

## 2011-06-22 LAB — BASIC METABOLIC PANEL
BUN: 16 mg/dL (ref 6–23)
CO2: 24 mEq/L (ref 19–32)
Chloride: 109 mEq/L (ref 96–112)
Glucose, Bld: 92 mg/dL (ref 70–99)
Potassium: 4.1 mEq/L (ref 3.5–5.1)

## 2011-06-22 LAB — HEPATIC FUNCTION PANEL
ALT: 40 U/L (ref 0–53)
AST: 22 U/L (ref 0–37)
Albumin: 4.5 g/dL (ref 3.5–5.2)
Total Bilirubin: 0.4 mg/dL (ref 0.3–1.2)

## 2011-06-22 NOTE — Patient Instructions (Signed)
Will obtain labs today and call you with the results.  continue same dose of medications  Your physician wants you to follow-up in: 4 months You will receive a reminder letter in the mail two months in advance. If you don't receive a letter, please call our office to schedule the follow-up appointment.

## 2011-06-22 NOTE — Assessment & Plan Note (Signed)
The patient has not been expressing any exertional chest discomfort or recurrent angina

## 2011-06-22 NOTE — Progress Notes (Signed)
[name] Date of Birth:  Nov 19, 1960 [facility] [address]   [address] [place], [place]  [address] [phone]           Fax   [phone]  History of Present Illness: This pleasant 50 year old gentleman is seen for a scheduled 3 month followup office visit.  He has a history of ischemic heart disease.  He had a nonsystemic at the end of May 2012 and was treated with a cutting balloon PCI to the ramus.  He had LV dysfunction with an ejection fraction of 45%.  He has not had any symptoms of congestive heart failure.  He remains on Plavix and aspirin.  He is back to work.  He quit smoking at the time of his heart attack and has not returned to smoking.  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  Current Outpatient Prescriptions  Medication Sig Dispense Refill  . aspirin 81 MG tablet Take 81 mg by mouth daily.        . clopidogrel (PLAVIX) 75 MG tablet Take 1 tablet (75 mg total) by mouth daily.  90 tablet  3  . lisinopril (PRINIVIL,ZESTRIL) 10 MG tablet Take 1 tablet (10 mg total) by mouth daily.  90 tablet  3  . MELOXICAM PO Take by mouth daily.        . metoprolol tartrate (LOPRESSOR) 25 MG tablet Take 12.5 mg by mouth 2 (two) times daily.        . nitroGLYCERIN (NITROSTAT) 0.4 MG SL tablet Place 1 tablet (0.4 mg total) under the tongue every 5 (five) minutes as needed for chest pain.  90 tablet  3  . pantoprazole (PROTONIX) 40 MG tablet Take 1 tablet (40 mg total) by mouth daily.  90 tablet  3  . rosuvastatin (CRESTOR) 10 MG tablet Take 1 tablet (10 mg total) by mouth daily.  90 tablet  3    Allergies  Allergen Reactions  . Statins     Patient Active Problem List  Diagnoses  . NSTEMI (non-ST elevated myocardial infarction)  . LV dysfunction  . Tobacco abuse  . Hyperlipidemia    History  Smoking status  . Former Smoker  . Types: Cigarettes  Smokeless tobacco  . Not on file   Comment: quit 2011    History  Alcohol Use  . Yes    Family History  Problem Relation Age of Onset  . Heart attack Mother   . Liver disease Father   . Kidney disease Father     Review of Systems: Constitutional: no fever chills diaphoresis or fatigue or change in weight.  Head and neck: no hearing loss, no epistaxis, no photophobia or visual disturbance. Respiratory: No cough, shortness of breath or wheezing. Cardiovascular: No chest pain peripheral edema, palpitations. Gastrointestinal: No abdominal distention, no abdominal pain, no change in bowel habits hematochezia or melena. Genitourinary: No dysuria, no frequency, no urgency, no nocturia. Musculoskeletal:No arthralgias, no back pain, no gait disturbance or myalgias. Neurological: No dizziness, no headaches, no numbness, no seizures, no syncope, no weakness, no tremors. Hematologic: No lymphadenopathy, no easy bruising. Psychiatric: No confusion, no hallucinations, no sleep disturbance.    Physical Exam: Filed Vitals:   06/22/11 0840  BP: 100/88  Pulse: 66   the general appearance reveals a well-developed, well-nourished, gentleman in no distress.The head and neck exam reveals pupils equal and reactive.  Extraocular movements  are full.  There is no scleral icterus.  The mouth and pharynx are normal.  The neck is supple.  The carotids reveal no bruits.  The jugular venous pressure is normal.  The  thyroid is not enlarged.  There is no lymphadenopathy.  The chest is clear to percussion and auscultation.  There are no rales or rhonchi.  Expansion of the chest is symmetrical.  The precordium is quiet.  The first heart sound is normal.  The second heart sound is physiologically split.  There is no murmur gallop rub or click.  There is no abnormal lift or heave.  The abdomen is soft and nontender.  The bowel sounds are normal.  The liver and spleen are not enlarged.  There are no abdominal masses.  There are no abdominal bruits.   Extremities reveal good pedal pulses.  There is no phlebitis or edema.  There is no cyanosis or clubbing.  Strength is normal and symmetrical in all extremities.  There is no lateralizing weakness.  There are no sensory deficits.  The skin is warm and dry.  There is no rash.     Assessment / Plan:  Blood work today is pending.  Stay on same medication.  Recheck in 4 months for followup office visit EKG, and fasting lab work

## 2011-06-22 NOTE — Assessment & Plan Note (Signed)
The patient is on Crestor 10 mg daily for his dyslipidemia.  Blood work pending.

## 2011-06-22 NOTE — Assessment & Plan Note (Signed)
The patient has not returned to smoking, although occasionally he has a strong desire to smoke

## 2011-06-25 ENCOUNTER — Telehealth: Payer: Self-pay | Admitting: *Deleted

## 2011-06-25 NOTE — Telephone Encounter (Signed)
Message copied by Burnell Blanks on Thu Jun 25, 2011  9:44 AM ------      Message from: Cassell Clement      Created: Mon Jun 22, 2011  6:39 PM       CHOL      138   06/22/2011      CHOL      143   03/23/2011      CHOL      189   02/02/2011      HDL    34.70   06/22/2011      HDL    29.60   03/23/2011      HDL       23   02/02/2011      LDLCALC       75   06/22/2011      LDLCALC       74   03/23/2011      LDLCALC   #   02/02/2011        Value: 118        Total Cholesterol/HDL:CHD Risk Coronary Heart Disease Risk Table                     Men   Women  1/2 Average Risk   3.4   3.3  Average Risk       5.0   4.4  2 X Average Risk   9.6   7.1  3 X Average Risk  23.4   11.0        Use the calculated Patient Ratio above and the CHD Risk Table to determine the patient's CHD Risk.        ATP III CLASSIFICATION (LDL):  <100     mg/dL   Optimal  161-096  mg/dL   Near or Above                    Optimal  130-159  mg/dL   Borderline  045-409  mg/dL   High  >811     mg/dL   Very High      TRIG    140.0   06/22/2011      TRIG    195.0   03/23/2011      TRIG      240   02/02/2011      CHOLHDL        4   06/22/2011      CHOLHDL        5   03/23/2011      CHOLHDL      8.2   02/02/2011      No results found for this basename: LDLDIRECT      Please report to the lipids are improved.  Her liver function studies and kidney function studies are good.  Continue present medication

## 2011-06-25 NOTE — Telephone Encounter (Signed)
Mailed copy, left message to call if any questions 

## 2011-06-25 NOTE — Progress Notes (Signed)
Mailed copy, left message any questions to call 

## 2011-07-07 ENCOUNTER — Telehealth: Payer: Self-pay | Admitting: Cardiology

## 2011-07-07 NOTE — Telephone Encounter (Signed)
Walk In pt Form received " [employer] Initial Claim For credit Disabilty Insurance"  I am sending to [employer] to see if they will complete,07/07/11/km

## 2011-07-17 ENCOUNTER — Telehealth: Payer: Self-pay | Admitting: Cardiovascular Disease

## 2011-07-17 NOTE — Telephone Encounter (Signed)
Having 8 teeth pulled, is this ok to hold

## 2011-07-17 NOTE — Telephone Encounter (Signed)
New Msg: Pt calling stating that he needs to have some dental work done and pt wants to know if he can be off plavix for one week. Please return pt call to discuss further.

## 2011-07-17 NOTE — Telephone Encounter (Signed)
Ok to hold.  Will fax ok to dentist office

## 2011-07-17 NOTE — Telephone Encounter (Signed)
Okay to hold Plavix for 5 days

## 2011-08-01 ENCOUNTER — Other Ambulatory Visit: Payer: Self-pay | Admitting: Cardiology

## 2011-08-01 DIAGNOSIS — K219 Gastro-esophageal reflux disease without esophagitis: Secondary | ICD-10-CM

## 2011-08-29 ENCOUNTER — Other Ambulatory Visit: Payer: Self-pay | Admitting: Cardiovascular Disease

## 2011-10-26 ENCOUNTER — Ambulatory Visit (INDEPENDENT_AMBULATORY_CARE_PROVIDER_SITE_OTHER): Payer: BC Managed Care – PPO | Admitting: Cardiology

## 2011-10-26 ENCOUNTER — Encounter: Payer: Self-pay | Admitting: Cardiology

## 2011-10-26 VITALS — BP 110/78 | HR 66 | Ht 70.0 in | Wt 198.0 lb

## 2011-10-26 DIAGNOSIS — Z720 Tobacco use: Secondary | ICD-10-CM

## 2011-10-26 DIAGNOSIS — I519 Heart disease, unspecified: Secondary | ICD-10-CM

## 2011-10-26 DIAGNOSIS — F17200 Nicotine dependence, unspecified, uncomplicated: Secondary | ICD-10-CM

## 2011-10-26 DIAGNOSIS — I119 Hypertensive heart disease without heart failure: Secondary | ICD-10-CM

## 2011-10-26 DIAGNOSIS — E7800 Pure hypercholesterolemia, unspecified: Secondary | ICD-10-CM

## 2011-10-26 DIAGNOSIS — E785 Hyperlipidemia, unspecified: Secondary | ICD-10-CM

## 2011-10-26 DIAGNOSIS — I214 Non-ST elevation (NSTEMI) myocardial infarction: Secondary | ICD-10-CM

## 2011-10-26 DIAGNOSIS — I2510 Atherosclerotic heart disease of native coronary artery without angina pectoris: Secondary | ICD-10-CM

## 2011-10-26 LAB — HEPATIC FUNCTION PANEL
ALT: 37 U/L (ref 0–53)
AST: 21 U/L (ref 0–37)
Bilirubin, Direct: 0 mg/dL (ref 0.0–0.3)
Total Bilirubin: 0.4 mg/dL (ref 0.3–1.2)
Total Protein: 6.4 g/dL (ref 6.0–8.3)

## 2011-10-26 LAB — LIPID PANEL
LDL Cholesterol: 80 mg/dL (ref 0–99)
Total CHOL/HDL Ratio: 4
Triglycerides: 110 mg/dL (ref 0.0–149.0)

## 2011-10-26 LAB — BASIC METABOLIC PANEL
Creatinine, Ser: 1 mg/dL (ref 0.4–1.5)
Sodium: 140 mEq/L (ref 135–145)

## 2011-10-26 NOTE — Assessment & Plan Note (Signed)
The patient is not having any orthopnea or paroxysmal nocturnal dyspnea or peripheral edema or other evidence of CHF

## 2011-10-26 NOTE — Progress Notes (Signed)
Robert Dyer Date of Birth:  04/01/1961 [facility] [address] [place], [place]  [address] [phone]         Fax   [phone]  History of Present Illness: This pleasant 51 year old gentleman is seen for a scheduled four-month followup office visit.  He has a history of known ischemic heart disease.  He had a non-STEMI in May of 2012 treated with a cutting balloon PCI to the ramus.  He had LV dysfunction with an ejection fraction of 45%.  He has not had any symptoms of congestive heart failure.  He quit smoking when he had his heart attack but unfortunately returned to smoking 2 months ago.  He has had no recurrent angina pectoris.  Is not having symptoms of congestive heart failure.  Current Outpatient Prescriptions  Medication Sig Dispense Refill  . ALPRAZolam (XANAX) 0.25 MG tablet Take 0.25 mg by mouth as directed.      Marland Kitchen aspirin 81 MG tablet Take 81 mg by mouth daily.        . clopidogrel (PLAVIX) 75 MG tablet Take 1 tablet (75 mg total) by mouth daily.  90 tablet  3  . lisinopril (PRINIVIL,ZESTRIL) 10 MG tablet Take 1 tablet (10 mg total) by mouth daily.  90 tablet  3  . MELOXICAM PO Take by mouth daily.        . metoprolol tartrate (LOPRESSOR) 25 MG tablet TAKE 1/2 TABLET BY MOUTH TWICE DAILY  30 tablet  6  . nitroGLYCERIN (NITROSTAT) 0.4 MG SL tablet Place 1 tablet (0.4 mg total) under the tongue every 5 (five) minutes as needed for chest pain.  90 tablet  3  . pantoprazole (PROTONIX) 40 MG tablet TAKE 1 TABLET BY MOUTH EVERY DAY  30 tablet  5  . rosuvastatin (CRESTOR) 10 MG tablet Take 1 tablet (10 mg total) by mouth daily.  90 tablet  3    Allergies  Allergen Reactions  . Statins     Patient Active Problem List  Diagnoses  . NSTEMI (non-ST elevated myocardial infarction)  . LV dysfunction  . Tobacco abuse  . Hyperlipidemia    History  Smoking status  . Current Everyday Smoker  . Types: Cigarettes  Smokeless tobacco  . Not on file    Comment: quit 2011    History  Alcohol Use  . Yes    Family History  Problem Relation Age of Onset  . Heart attack Mother   . Liver disease Father   . Kidney disease Father     Review of Systems: Constitutional: no fever chills diaphoresis or fatigue or change in weight.  Head and neck: no hearing loss, no epistaxis, no photophobia or visual disturbance. Respiratory: No cough, shortness of breath or wheezing. Cardiovascular: No chest pain peripheral edema, palpitations. Gastrointestinal: No abdominal distention, no abdominal pain, no change in bowel habits hematochezia or melena. Genitourinary: No dysuria, no frequency, no urgency, no nocturia. Musculoskeletal:No arthralgias, no back pain, no gait disturbance or myalgias. Neurological: No dizziness, no headaches, no numbness, no seizures, no syncope, no weakness, no tremors. Hematologic: No lymphadenopathy, no easy bruising. Psychiatric: No confusion, no hallucinations, no sleep disturbance.    Physical Exam: Filed Vitals:   10/26/11 0837  BP: 110/78  Pulse: 66   the general appearance reveals a well-developed well-nourished gentleman in no distress.The head and neck exam reveals pupils equal and reactive.  Extraocular movements are full.  There is no scleral icterus.  The mouth and pharynx are  normal.  The neck is supple.  The carotids reveal no bruits.  The jugular venous pressure is normal.  The  thyroid is not enlarged.  There is no lymphadenopathy.  The chest is clear to percussion and auscultation.  There are no rales or rhonchi.  Expansion of the chest is symmetrical.  The precordium is quiet.  The first heart sound is normal.  The second heart sound is physiologically split.  There is no murmur gallop rub or click.  There is no abnormal lift or heave.  The abdomen is soft and nontender.  The bowel sounds are normal.  The liver and spleen are not enlarged.  There are no abdominal masses.  There are no abdominal bruits.   Extremities reveal good pedal pulses.  There is no phlebitis or edema.  There is no cyanosis or clubbing.  Strength is normal and symmetrical in all extremities.  There is no lateralizing weakness.  There are no sensory deficits.  The skin is warm and dry.  There is no rash.  EKG shows normal sinus rhythm and an old small anteroseptal myocardial infarction and no ischemic changes   Assessment / Plan:  Continue same medication.  Stop smoking.  Recheck in 4 months for followup office visit and fasting lab work.

## 2011-10-26 NOTE — Patient Instructions (Signed)
YOU NEED TO TRY TO STOP SMOKING   Will obtain labs today and call you with the results (LP/BMET/HFP)  Your physician recommends that you continue on your current medications as directed. Please refer to the Current Medication list given to you  today.  Your physician recommends that you schedule a follow-up appointment in: 4 months with fasting labs (lp/bmet/hfp)

## 2011-10-26 NOTE — Assessment & Plan Note (Signed)
The patient has had no recurrent angina pectoris.  He has not been walking as much because of some problems with arthritis of his feet.  Fortunately however his weight is down 5 pounds.  This may however be because he is smoking again.

## 2011-10-26 NOTE — Assessment & Plan Note (Signed)
I counseled him to do about the importance of quitting smoking again.

## 2011-10-26 NOTE — Progress Notes (Signed)
Quick Note:  Please report to patient. The recent labs are stable. Continue same medication and careful diet. ______ 

## 2011-11-02 ENCOUNTER — Telehealth: Payer: Self-pay | Admitting: *Deleted

## 2011-11-02 NOTE — Telephone Encounter (Signed)
Mailed copy of labs and left message to call if any questions  

## 2011-11-02 NOTE — Telephone Encounter (Signed)
Message copied by Burnell Blanks on Mon Nov 02, 2011 10:59 AM ------      Message from: Cassell Clement      Created: Mon Oct 26, 2011  7:19 PM       Please report to patient.  The recent labs are stable. Continue same medication and careful diet.

## 2012-01-21 ENCOUNTER — Other Ambulatory Visit: Payer: Self-pay | Admitting: Cardiology

## 2012-01-21 NOTE — Telephone Encounter (Signed)
Refilled pantoprazole.

## 2012-01-27 ENCOUNTER — Other Ambulatory Visit: Payer: Self-pay | Admitting: *Deleted

## 2012-01-27 DIAGNOSIS — F419 Anxiety disorder, unspecified: Secondary | ICD-10-CM

## 2012-01-27 MED ORDER — ALPRAZOLAM 0.25 MG PO TABS: 0.2500 mg | ORAL_TABLET | Freq: Three times a day (TID) | ORAL | Status: DC | PRN

## 2012-01-29 ENCOUNTER — Other Ambulatory Visit (HOSPITAL_COMMUNITY): Payer: Self-pay | Admitting: *Deleted

## 2012-02-22 ENCOUNTER — Other Ambulatory Visit (INDEPENDENT_AMBULATORY_CARE_PROVIDER_SITE_OTHER): Payer: BC Managed Care – PPO

## 2012-02-22 ENCOUNTER — Other Ambulatory Visit: Payer: Self-pay | Admitting: Cardiology

## 2012-02-22 ENCOUNTER — Encounter: Payer: Self-pay | Admitting: Cardiology

## 2012-02-22 ENCOUNTER — Ambulatory Visit (INDEPENDENT_AMBULATORY_CARE_PROVIDER_SITE_OTHER): Payer: BC Managed Care – PPO | Admitting: Cardiology

## 2012-02-22 VITALS — BP 118/78 | HR 66 | Ht 70.0 in | Wt 195.0 lb

## 2012-02-22 DIAGNOSIS — I214 Non-ST elevation (NSTEMI) myocardial infarction: Secondary | ICD-10-CM

## 2012-02-22 DIAGNOSIS — F411 Generalized anxiety disorder: Secondary | ICD-10-CM

## 2012-02-22 DIAGNOSIS — Z720 Tobacco use: Secondary | ICD-10-CM

## 2012-02-22 DIAGNOSIS — F419 Anxiety disorder, unspecified: Secondary | ICD-10-CM

## 2012-02-22 DIAGNOSIS — E785 Hyperlipidemia, unspecified: Secondary | ICD-10-CM

## 2012-02-22 DIAGNOSIS — I519 Heart disease, unspecified: Secondary | ICD-10-CM

## 2012-02-22 DIAGNOSIS — F17200 Nicotine dependence, unspecified, uncomplicated: Secondary | ICD-10-CM

## 2012-02-22 LAB — HEPATIC FUNCTION PANEL
AST: 22 U/L (ref 0–37)
Alkaline Phosphatase: 47 U/L (ref 39–117)
Total Bilirubin: 0.2 mg/dL — ABNORMAL LOW (ref 0.3–1.2)

## 2012-02-22 LAB — LIPID PANEL: Total CHOL/HDL Ratio: 3

## 2012-02-22 MED ORDER — ALPRAZOLAM 0.25 MG PO TABS: 0.2500 mg | ORAL_TABLET | Freq: Three times a day (TID) | ORAL | Status: DC | PRN

## 2012-02-22 MED ORDER — METOPROLOL TARTRATE 25 MG PO TABS: 12.5000 mg | ORAL_TABLET | Freq: Two times a day (BID) | ORAL | Status: DC

## 2012-02-22 MED ORDER — LISINOPRIL 10 MG PO TABS: 10.0000 mg | ORAL_TABLET | Freq: Every day | ORAL | Status: DC

## 2012-02-22 MED ORDER — PANTOPRAZOLE SODIUM 40 MG PO TBEC: 40.0000 mg | DELAYED_RELEASE_TABLET | Freq: Every day | ORAL | Status: DC

## 2012-02-22 MED ORDER — ROSUVASTATIN CALCIUM 10 MG PO TABS: 10.0000 mg | ORAL_TABLET | Freq: Every day | ORAL | Status: DC

## 2012-02-22 MED ORDER — CLOPIDOGREL BISULFATE 75 MG PO TABS: 75.0000 mg | ORAL_TABLET | Freq: Every day | ORAL | Status: DC

## 2012-02-22 NOTE — Assessment & Plan Note (Signed)
The patient denies any chest pain or angina pectoris. 

## 2012-02-22 NOTE — Progress Notes (Signed)
Robert Dyer Date of Birth:  [birth year] [facility] [address] [place], [place]  [address] [phone]         Fax   [phone]  History of Present Illness: This pleasant 51 year old gentleman is seen for a scheduled four-month followup office visit.  He has a history of ischemic heart disease.  He had a non-STEMI in May 2012 treated with a cutting balloon PCI to the ramus.  He has a history of LV dysfunction with an ejection fraction of 45%.  He quit smoking at the time of his heart attack but then started back and is now smoking a half a pack a day against advice.  The patient has not been experiencing any angina pectoris or symptoms of CHF.  Patient reports that the nurse at work has told him that his blood pressure is frequently low.  He has not been having any dizziness however. Current Outpatient Prescriptions  Medication Sig Dispense Refill  . ALPRAZolam (XANAX) 0.25 MG tablet Take 1 tablet (0.25 mg total) by mouth 3 (three) times daily as needed for sleep.  90 tablet  2  . aspirin 81 MG tablet Take 81 mg by mouth daily.        . clopidogrel (PLAVIX) 75 MG tablet Take 1 tablet (75 mg total) by mouth daily.  90 tablet  3  . lisinopril (PRINIVIL,ZESTRIL) 10 MG tablet Take 1 tablet (10 mg total) by mouth daily.  90 tablet  3  . metoprolol tartrate (LOPRESSOR) 25 MG tablet Take 0.5 tablets (12.5 mg total) by mouth 2 (two) times daily.  30 tablet  6  . nitroGLYCERIN (NITROSTAT) 0.4 MG SL tablet Place 1 tablet (0.4 mg total) under the tongue every 5 (five) minutes as needed for chest pain.  90 tablet  3  . pantoprazole (PROTONIX) 40 MG tablet Take 1 tablet (40 mg total) by mouth daily.  30 tablet  5  . rosuvastatin (CRESTOR) 10 MG tablet Take 1 tablet (10 mg total) by mouth daily.  90 tablet  3  . DISCONTD: clopidogrel (PLAVIX) 75 MG tablet Take 1 tablet (75 mg total) by mouth daily.  90 tablet  3  . DISCONTD: lisinopril (PRINIVIL,ZESTRIL) 10 MG tablet Take 1  tablet (10 mg total) by mouth daily.  90 tablet  3  . DISCONTD: metoprolol tartrate (LOPRESSOR) 25 MG tablet TAKE 1/2 TABLET BY MOUTH TWICE DAILY  30 tablet  6  . DISCONTD: pantoprazole (PROTONIX) 40 MG tablet TAKE 1 TABLET BY MOUTH EVERY DAY  30 tablet  5  . DISCONTD: rosuvastatin (CRESTOR) 10 MG tablet Take 1 tablet (10 mg total) by mouth daily.  90 tablet  3    Allergies  Allergen Reactions  . Statins     Patient Active Problem List  Diagnosis  . NSTEMI (non-ST elevated myocardial infarction)  . LV dysfunction  . Tobacco abuse  . Hyperlipidemia    History  Smoking status  . Current Everyday Smoker  . Types: Cigarettes  Smokeless tobacco  . Not on file  Comment: quit 2011    History  Alcohol Use  . Yes    Family History  Problem Relation Age of Onset  . Heart attack Mother   . Liver disease Father   . Kidney disease Father     Review of Systems: Constitutional: no fever chills diaphoresis or fatigue or change in weight.  Head and neck: no hearing loss, no epistaxis, no photophobia or visual disturbance. Respiratory: No cough,  shortness of breath or wheezing. Cardiovascular: No chest pain peripheral edema, palpitations. Gastrointestinal: No abdominal distention, no abdominal pain, no change in bowel habits hematochezia or melena. Genitourinary: No dysuria, no frequency, no urgency, no nocturia. Musculoskeletal:No arthralgias, no back pain, no gait disturbance or myalgias. Neurological: No dizziness, no headaches, no numbness, no seizures, no syncope, no weakness, no tremors. Hematologic: No lymphadenopathy, no easy bruising. Psychiatric: No confusion, no hallucinations, no sleep disturbance.    Physical Exam: Filed Vitals:   02/22/12 0907  BP: 118/78  Pulse: 66   the general appearance reveals a well-developed well-nourished gentleman in no distress.  The head and neck exam reveals pupils equal and reactive.  Extraocular movements are full.  There is no  scleral icterus.  The mouth and pharynx are normal.  The neck is supple.  The carotids reveal no bruits.  The jugular venous pressure is normal.  The  thyroid is not enlarged.  There is no lymphadenopathy.  The chest is clear to percussion and auscultation.  There are no rales or rhonchi.  Expansion of the chest is symmetrical.  The precordium is quiet.  The first heart sound is normal.  The second heart sound is physiologically split.  There is no murmur gallop rub or click.  There is no abnormal lift or heave.  The abdomen is soft and nontender.  The bowel sounds are normal.  The liver and spleen are not enlarged.  There are no abdominal masses.  There are no abdominal bruits.  Extremities reveal good pedal pulses.  There is no phlebitis or edema.  There is no cyanosis or clubbing.  Strength is normal and symmetrical in all extremities.  There is no lateralizing weakness.  There are no sensory deficits.  The skin is warm and dry.  There is no rash.     Assessment / Plan: Continue same medication.  Work on quitting smoking entirely.  For his low blood pressure he may start a small amount of salt to his food but her chart for peripheral edema or unusual weight gain.  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  Recheck here in 4 months for followup office visit and fasting lab work

## 2012-02-22 NOTE — Assessment & Plan Note (Signed)
The patient is on Crestor 10 mg daily.  Is not having any myalgias.  We're checking lab today.

## 2012-02-22 NOTE — Telephone Encounter (Signed)
Request already done by fax

## 2012-02-22 NOTE — Telephone Encounter (Signed)
Refill done by fax already

## 2012-02-22 NOTE — Assessment & Plan Note (Signed)
The patient has not been having any symptoms of congestive heart failure.  He is on lisinopril and a beta blocker.  He is not on diuretics.

## 2012-02-22 NOTE — Patient Instructions (Signed)
Your physician recommends that you schedule a follow-up appointment in: 4 months with Dr. Patty Sermons.  Your physician recommends that you return for lab work in: 4 months.  Lipids/Liver.  Continue current medicines.  Add a small amount of extra salt to your food.  Smoking Cessation This document explains the best ways for you to quit smoking and new treatments to help. It lists new medicines that can double or triple your chances of quitting and quitting for good. It also considers ways to avoid relapses and concerns you may have about quitting, including weight gain. NICOTINE: A POWERFUL ADDICTION If you have tried to quit smoking, you know how hard it can be. It is hard because nicotine is a very addictive drug. For some people, it can be as addictive as heroin or cocaine. Usually, people make 2 or 3 tries, or more, before finally being able to quit. Each time you try to quit, you can learn about what helps and what hurts. Quitting takes hard work and a lot of effort, but you can quit smoking. QUITTING SMOKING IS ONE OF THE MOST IMPORTANT THINGS YOU WILL EVER DO.  You will live longer, feel better, and live better.   The impact on your body of quitting smoking is felt almost immediately:   Within 20 minutes, blood pressure decreases. Pulse returns to its normal level.   After 8 hours, carbon monoxide levels in the blood return to normal. Oxygen level increases.   After 24 hours, chance of heart attack starts to decrease. Breath, hair, and body stop smelling like smoke.   After 48 hours, damaged nerve endings begin to recover. Sense of taste and smell improve.   After 72 hours, the body is virtually free of nicotine. Bronchial tubes relax and breathing becomes easier.   After 2 to 12 weeks, lungs can hold more air. Exercise becomes easier and circulation improves.   Quitting will reduce your risk of having a heart attack, stroke, cancer, or lung disease:   After 1 year, the risk of  coronary heart disease is cut in half.   After 5 years, the risk of stroke falls to the same as a nonsmoker.   After 10 years, the risk of lung cancer is cut in half and the risk of other cancers decreases significantly.   After 15 years, the risk of coronary heart disease drops, usually to the level of a nonsmoker.   If you are pregnant, quitting smoking will improve your chances of having a healthy baby.   The people you live with, especially your children, will be healthier.   You will have extra money to spend on things other than cigarettes.  FIVE KEYS TO QUITTING Studies have shown that these 5 steps will help you quit smoking and quit for good. You have the best chances of quitting if you use them together: 1. Get ready.  2. Get support and encouragement.  3. Learn new skills and behaviors.  4. Get medicine to reduce your nicotine addiction and use it correctly.  5. Be prepared for relapse or difficult situations. Be determined to continue trying to quit, even if you do not succeed at first.  1. GET READY  Set a quit date.   Change your environment.   Get rid of ALL cigarettes, ashtrays, matches, and lighters in your home, car, and place of work.   Do not let people smoke in your home.   Review your past attempts to quit. Think about what worked and  what did not.   Once you quit, do not smoke. NOT EVEN A PUFF!  2. GET SUPPORT AND ENCOURAGEMENT Studies have shown that you have a better chance of being successful if you have help. You can get support in many ways.  Tell your family, friends, and coworkers that you are going to quit and need their support. Ask them not to smoke around you.   Talk to your caregivers (doctor, dentist, nurse, pharmacist, psychologist, and/or smoking counselor).   Get individual, group, or telephone counseling and support. The more counseling you have, the better your chances are of quitting. Programs are available at [employer] and  health centers. Call your local health department for information about programs in your area.   Spiritual beliefs and practices may help some smokers quit.   Quit meters are Photographer that keep track of quit statistics, such as amount of "quit-time," cigarettes not smoked, and money saved.   Many smokers find one or more of the many self-help books available useful in helping them quit and stay off tobacco.  3. LEARN NEW SKILLS AND BEHAVIORS  Try to distract yourself from urges to smoke. Talk to someone, go for a walk, or occupy your time with a task.   When you first try to quit, change your routine. Take a different route to work. Drink tea instead of coffee. Eat breakfast in a different place.   Do something to reduce your stress. Take a hot bath, exercise, or read a book.   Plan something enjoyable to do every day. Reward yourself for not smoking.   Explore interactive web-based programs that specialize in helping you quit.  4. GET MEDICINE AND USE IT CORRECTLY Medicines can help you stop smoking and decrease the urge to smoke. Combining medicine with the above behavioral methods and support can quadruple your chances of successfully quitting smoking. The U.S. Food and Drug Administration (FDA) has approved 7 medicines to help you quit smoking. These medicines fall into 3 categories.  Nicotine replacement therapy (delivers nicotine to your body without the negative effects and risks of smoking):   Nicotine gum: Available over-the-counter.   Nicotine lozenges: Available over-the-counter.   Nicotine inhaler: Available by prescription.   Nicotine nasal spray: Available by prescription.   Nicotine skin patches (transdermal): Available by prescription and over-the-counter.   Antidepressant medicine (helps people abstain from smoking, but how this works is unknown):   Bupropion sustained-release (SR) tablets: Available by prescription.    Nicotinic receptor partial agonist (simulates the effect of nicotine in your brain):   Varenicline tartrate tablets: Available by prescription.   Ask your caregiver for advice about which medicines to use and how to use them. Carefully read the information on the package.   Everyone who is trying to quit may benefit from using a medicine. If you are pregnant or trying to become pregnant, nursing an infant, you are under age 38, or you smoke fewer than 10 cigarettes per day, talk to your caregiver before taking any nicotine replacement medicines.   You should stop using a nicotine replacement product and call your caregiver if you experience nausea, dizziness, weakness, vomiting, fast or irregular heartbeat, mouth problems with the lozenge or gum, or redness or swelling of the skin around the patch that does not go away.   Do not use any other product containing nicotine while using a nicotine replacement product.   Talk to your caregiver before using these products if you have  diabetes, heart disease, asthma, stomach ulcers, you had a recent heart attack, you have high blood pressure that is not controlled with medicine, a history of irregular heartbeat, or you have been prescribed medicine to help you quit smoking.  5. BE PREPARED FOR RELAPSE OR DIFFICULT SITUATIONS  Most relapses occur within the first 3 months after quitting. Do not be discouraged if you start smoking again. Remember, most people try several times before they finally quit.   You may have symptoms of withdrawal because your body is used to nicotine. You may crave cigarettes, be irritable, feel very hungry, cough often, get headaches, or have difficulty concentrating.   The withdrawal symptoms are only temporary. They are strongest when you first quit, but they will go away within 10 to 14 days.  Here are some difficult situations to watch for:  Alcohol. Avoid drinking alcohol. Drinking lowers your chances of successfully  quitting.   Caffeine. Try to reduce the amount of caffeine you consume. It also lowers your chances of successfully quitting.   Other smokers. Being around smoking can make you want to smoke. Avoid smokers.   Weight gain. Many smokers will gain weight when they quit, usually less than 10 pounds. Eat a healthy diet and stay active. Do not let weight gain distract you from your main goal, quitting smoking. Some medicines that help you quit smoking may also help delay weight gain. You can always lose the weight gained after you quit.   Bad mood or depression. There are a lot of ways to improve your mood other than smoking.  If you are having problems with any of these situations, talk to your caregiver. SPECIAL SITUATIONS AND CONDITIONS Studies suggest that everyone can quit smoking. Your situation or condition can give you a special reason to quit.  Pregnant women/new mothers: By quitting, you protect your baby's health and your own.   Hospitalized patients: By quitting, you reduce health problems and help healing.   Heart attack patients: By quitting, you reduce your risk of a second heart attack.   Lung, head, and neck cancer patients: By quitting, you reduce your chance of a second cancer.   Parents of children and adolescents: By quitting, you protect your children from illnesses caused by secondhand smoke.  QUESTIONS TO THINK ABOUT Think about the following questions before you try to stop smoking. You may want to talk about your answers with your caregiver.  Why do you want to quit?   If you tried to quit in the past, what helped and what did not?   What will be the most difficult situations for you after you quit? How will you plan to handle them?   Who can help you through the tough times? Your family? Friends? Caregiver?   What pleasures do you get from smoking? What ways can you still get pleasure if you quit?  Here are some questions to ask your caregiver:  How can you  help me to be successful at quitting?   What medicine do you think would be best for me and how should I take it?   What should I do if I need more help?   What is smoking withdrawal like? How can I get information on withdrawal?  Quitting takes hard work and a lot of effort, but you can quit smoking. FOR MORE INFORMATION  Smokefree.gov (http://www.davis-sullivan.com/) provides free, accurate, evidence-based information and professional assistance to help support the immediate and long-term needs of people trying to quit smoking.  Document Released: 08/18/2001 Document Revised: 08/13/2011 Document Reviewed: 06/10/2009 [facility] Patient Information 2012 [place], [place].

## 2012-02-22 NOTE — Progress Notes (Signed)
Quick Note:  Please report to patient. The recent labs are stable. Continue same medication and careful diet. ______ 

## 2012-02-22 NOTE — Assessment & Plan Note (Signed)
The patient is smoking a half a pack of cigarettes a day.  I talked to him at length about the importance of quitting smoking entirely once again

## 2012-04-22 ENCOUNTER — Other Ambulatory Visit: Payer: Self-pay | Admitting: *Deleted

## 2012-04-22 DIAGNOSIS — F419 Anxiety disorder, unspecified: Secondary | ICD-10-CM

## 2012-04-22 MED ORDER — ALPRAZOLAM 0.25 MG PO TABS: 0.2500 mg | ORAL_TABLET | Freq: Three times a day (TID) | ORAL | Status: DC | PRN

## 2012-04-22 NOTE — Telephone Encounter (Signed)
Refilled alprazolam 

## 2012-04-26 ENCOUNTER — Other Ambulatory Visit: Payer: Self-pay | Admitting: Cardiovascular Disease

## 2012-07-11 ENCOUNTER — Ambulatory Visit (INDEPENDENT_AMBULATORY_CARE_PROVIDER_SITE_OTHER): Payer: BC Managed Care – PPO | Admitting: Cardiology

## 2012-07-11 ENCOUNTER — Encounter: Payer: Self-pay | Admitting: Cardiology

## 2012-07-11 VITALS — BP 118/64 | HR 60 | Ht 70.0 in | Wt 193.0 lb

## 2012-07-11 DIAGNOSIS — F419 Anxiety disorder, unspecified: Secondary | ICD-10-CM

## 2012-07-11 DIAGNOSIS — E785 Hyperlipidemia, unspecified: Secondary | ICD-10-CM

## 2012-07-11 DIAGNOSIS — Z720 Tobacco use: Secondary | ICD-10-CM

## 2012-07-11 DIAGNOSIS — I214 Non-ST elevation (NSTEMI) myocardial infarction: Secondary | ICD-10-CM

## 2012-07-11 DIAGNOSIS — F17200 Nicotine dependence, unspecified, uncomplicated: Secondary | ICD-10-CM

## 2012-07-11 DIAGNOSIS — F411 Generalized anxiety disorder: Secondary | ICD-10-CM

## 2012-07-11 DIAGNOSIS — I519 Heart disease, unspecified: Secondary | ICD-10-CM

## 2012-07-11 LAB — BASIC METABOLIC PANEL
BUN: 9 mg/dL (ref 6–23)
Calcium: 8.8 mg/dL (ref 8.4–10.5)
GFR: 83.37 mL/min (ref >60.00)
Glucose, Bld: 98 mg/dL (ref 70–99)

## 2012-07-11 LAB — HEPATIC FUNCTION PANEL
ALT: 34 U/L (ref 0–53)
AST: 24 U/L (ref 0–37)
Bilirubin, Direct: 0 mg/dL (ref 0.0–0.3)
Total Bilirubin: 0.5 mg/dL (ref 0.3–1.2)

## 2012-07-11 LAB — LIPID PANEL: VLDL: 14 mg/dL (ref 0.0–40.0)

## 2012-07-11 MED ORDER — ALPRAZOLAM 0.25 MG PO TABS: 0.2500 mg | ORAL_TABLET | Freq: Three times a day (TID) | ORAL | Status: DC | PRN

## 2012-07-11 MED ORDER — LISINOPRIL 5 MG PO TABS: 5.0000 mg | ORAL_TABLET | Freq: Every day | ORAL | Status: DC

## 2012-07-11 NOTE — Progress Notes (Signed)
Quick Note:  Please report to patient. The recent labs are stable. Continue same medication and careful diet. ______ 

## 2012-07-11 NOTE — Assessment & Plan Note (Signed)
Blood pressure is remaining low and we will decrease his lisinopril down to 5 mg daily

## 2012-07-11 NOTE — Patient Instructions (Addendum)
Decrease your Lisinopril to 5 mg daily, Rx sent to [facility]. TAKE 1/2 OF YOUR 10 MG UNTIL YOU GET NEW RX FROM [facility] AND THEN 1 TABLET DAILY  Your physician recommends that you schedule a follow-up appointment in: 4 months with fasting labs (lp/bmet/hfp)   Will obtain labs today and call you with the results (LP/BMET/HFP)-

## 2012-07-11 NOTE — Assessment & Plan Note (Signed)
Unfortunately the patient continues to smoke about half a pack of cigarettes a day.  We talked once again about the importance of quitting smoking

## 2012-07-11 NOTE — Assessment & Plan Note (Signed)
The patient has not been having any recurrent chest discomfort to suggest myocardial ischemia

## 2012-07-11 NOTE — Progress Notes (Signed)
Robert Dyer Date of Birth:  09-10-1960 [facility] [address] [place], [place]  [address] [phone]         Fax   [phone]  History of Present Illness: This pleasant 51 year old gentleman is seen for a scheduled four-month followup office visit. He has a history of ischemic heart disease. He had a non-STEMI in May 2012 treated with a cutting balloon PCI to the ramus. He has a history of LV dysfunction with an ejection fraction of 45%. He quit smoking at the time of his heart attack but then started back and is now smoking a half a pack a day against advice. The patient has not been experiencing any angina pectoris or symptoms of CHF.  The patient brought in a list of blood pressures by his nurse at work.  He has several blood pressures in the range of 90/50.  He has not been having any dizziness or syncope.   Current Outpatient Prescriptions  Medication Sig Dispense Refill  . ALPRAZolam (XANAX) 0.25 MG tablet Take 1 tablet (0.25 mg total) by mouth 3 (three) times daily as needed for sleep.  90 tablet  2  . aspirin 81 MG tablet Take 81 mg by mouth daily.        . clopidogrel (PLAVIX) 75 MG tablet Take 1 tablet (75 mg total) by mouth daily.  90 tablet  3  . lisinopril (PRINIVIL,ZESTRIL) 5 MG tablet Take 1 tablet (5 mg total) by mouth daily.  90 tablet  3  . metoprolol tartrate (LOPRESSOR) 25 MG tablet TAKE 1/2 TABLET BY MOUTH TWICE DAILY  30 tablet  9  . pantoprazole (PROTONIX) 40 MG tablet Take 1 tablet (40 mg total) by mouth daily.  30 tablet  5  . rosuvastatin (CRESTOR) 10 MG tablet Take 1 tablet (10 mg total) by mouth daily.  90 tablet  3  . [DISCONTINUED] lisinopril (PRINIVIL,ZESTRIL) 10 MG tablet Take 1 tablet (10 mg total) by mouth daily.  90 tablet  3  . [DISCONTINUED] lisinopril (PRINIVIL,ZESTRIL) 5 MG tablet Take 1 tablet (5 mg total) by mouth daily.  90 tablet  3  . nitroGLYCERIN (NITROSTAT) 0.4 MG SL tablet Place 1 tablet (0.4 mg total) under the  tongue every 5 (five) minutes as needed for chest pain.  90 tablet  3  . [DISCONTINUED] metoprolol tartrate (LOPRESSOR) 25 MG tablet Take 0.5 tablets (12.5 mg total) by mouth 2 (two) times daily.  30 tablet  6    Allergies  Allergen Reactions  . Statins     Patient Active Problem List  Diagnosis  . NSTEMI (non-ST elevated myocardial infarction)  . LV dysfunction  . Tobacco abuse  . Hyperlipidemia    History  Smoking status  . Current Every Day Smoker  . Types: Cigarettes  Smokeless tobacco  . Not on file    Comment: quit 2011    History  Alcohol Use  . Yes    Family History  Problem Relation Age of Onset  . Heart attack Mother   . Liver disease Father   . Kidney disease Father     Review of Systems: Constitutional: no fever chills diaphoresis or fatigue or change in weight.  Head and neck: no hearing loss, no epistaxis, no photophobia or visual disturbance. Respiratory: No cough, shortness of breath or wheezing. Cardiovascular: No chest pain peripheral edema, palpitations. Gastrointestinal: No abdominal distention, no abdominal pain, no change in bowel habits hematochezia or melena. Genitourinary: No dysuria, no frequency, no  urgency, no nocturia. Musculoskeletal:No arthralgias, no back pain, no gait disturbance or myalgias. Neurological: No dizziness, no headaches, no numbness, no seizures, no syncope, no weakness, no tremors. Hematologic: No lymphadenopathy, no easy bruising. Psychiatric: No confusion, no hallucinations, no sleep disturbance.    Physical Exam: Filed Vitals:   07/11/12 0959  BP: 118/64  Pulse: 60   the general appearance reveals well-developed well-nourished gentleman in no distress.The head and neck exam reveals pupils equal and reactive.  Extraocular movements are full.  There is no scleral icterus.  The mouth and pharynx are normal.  The neck is supple.  The carotids reveal no bruits.  The jugular venous pressure is normal.  The  thyroid  is not enlarged.  There is no lymphadenopathy.  The chest is clear to percussion and auscultation.  There are no rales or rhonchi.  Expansion of the chest is symmetrical.  The precordium is quiet.  The first heart sound is normal.  The second heart sound is physiologically split.  There is no murmur gallop rub or click.  There is no abnormal lift or heave.  The abdomen is soft and nontender.  The bowel sounds are normal.  The liver and spleen are not enlarged.  There are no abdominal masses.  There are no abdominal bruits.  Extremities reveal good pedal pulses.  There is no phlebitis or edema.  There is no cyanosis or clubbing.  Strength is normal and symmetrical in all extremities.  There is no lateralizing weakness.  There are no sensory deficits.  The skin is warm and dry.  There is no rash.    Assessment / Plan: Continue same medication except for reduction in lisinopril dose.  Recheck in 4 months for followup office visit EKG and fasting lab work.  Blood work today is pending.  Once again discussed the importance of quitting smoking.

## 2012-07-11 NOTE — Assessment & Plan Note (Signed)
The patient has a history of dyslipidemia.  He is on Crestor and is not having any myalgias

## 2012-07-12 ENCOUNTER — Telehealth: Payer: Self-pay | Admitting: *Deleted

## 2012-07-12 NOTE — Telephone Encounter (Signed)
Message copied by Burnell Blanks on Tue Jul 12, 2012  5:26 PM ------      Message from: Cassell Clement      Created: Mon Jul 11, 2012  8:31 PM       Please report to patient.  The recent labs are stable. Continue same medication and careful diet.

## 2012-07-12 NOTE — Telephone Encounter (Signed)
Advised patient of lab results  

## 2012-07-21 ENCOUNTER — Other Ambulatory Visit: Payer: Self-pay

## 2012-07-21 MED ORDER — PANTOPRAZOLE SODIUM 40 MG PO TBEC: 40.0000 mg | DELAYED_RELEASE_TABLET | Freq: Every day | ORAL | Status: DC

## 2012-07-21 NOTE — Telephone Encounter (Signed)
New Problem:    Patient called in needing a refill of his pantoprazole (PROTONIX) 40 MG tablet sent in to [facility] and his ALPRAZolam (XANAX) 0.25 MG tablet sent in to the [facility] listed on his profile.

## 2012-07-22 ENCOUNTER — Other Ambulatory Visit: Payer: Self-pay | Admitting: Cardiology

## 2012-07-22 DIAGNOSIS — F419 Anxiety disorder, unspecified: Secondary | ICD-10-CM

## 2012-07-22 MED ORDER — PANTOPRAZOLE SODIUM 40 MG PO TBEC: 40.0000 mg | DELAYED_RELEASE_TABLET | Freq: Every day | ORAL | Status: DC

## 2012-11-07 ENCOUNTER — Encounter: Payer: Self-pay | Admitting: Cardiology

## 2012-11-07 ENCOUNTER — Ambulatory Visit (INDEPENDENT_AMBULATORY_CARE_PROVIDER_SITE_OTHER): Payer: BC Managed Care – PPO | Admitting: Cardiology

## 2012-11-07 ENCOUNTER — Other Ambulatory Visit (INDEPENDENT_AMBULATORY_CARE_PROVIDER_SITE_OTHER): Payer: BC Managed Care – PPO

## 2012-11-07 VITALS — BP 112/68 | HR 68 | Resp 12 | Ht 70.0 in | Wt 194.0 lb

## 2012-11-07 DIAGNOSIS — I519 Heart disease, unspecified: Secondary | ICD-10-CM

## 2012-11-07 LAB — HEPATIC FUNCTION PANEL
Alkaline Phosphatase: 45 U/L (ref 39–117)
Bilirubin, Direct: 0.1 mg/dL (ref 0.0–0.3)
Total Bilirubin: 0.5 mg/dL (ref 0.3–1.2)

## 2012-11-07 LAB — BASIC METABOLIC PANEL
BUN: 11 mg/dL (ref 6–23)
CO2: 23 mEq/L (ref 19–32)
Chloride: 108 mEq/L (ref 96–112)
Creatinine, Ser: 1 mg/dL (ref 0.4–1.5)

## 2012-11-07 LAB — LIPID PANEL
LDL Cholesterol: 68 mg/dL (ref 0–99)
Total CHOL/HDL Ratio: 4

## 2012-11-07 MED ORDER — VARENICLINE TARTRATE 0.5 MG PO TABS: ORAL_TABLET | ORAL | Status: DC

## 2012-11-07 MED ORDER — VARENICLINE TARTRATE 1 MG PO TABS: 1.0000 mg | ORAL_TABLET | Freq: Two times a day (BID) | ORAL | Status: DC

## 2012-11-07 NOTE — Patient Instructions (Signed)
Your physician has recommended you make the following change in your medication:   Start chantix...0.5 mg for 3 days                          0.5 mg twice a day for 4 days                           1 mg twice a day for 11 weeks  Your physician recommends that you return for a FASTING lipid profile: today and in 4 months   Your physician wants you to follow-up in: 4 months  You will receive a reminder letter in the mail two months in advance. If you don't receive a letter, please call our office to schedule the follow-up appointment.

## 2012-11-07 NOTE — Assessment & Plan Note (Signed)
The patient has not been experiencing any chest pain or angina. 

## 2012-11-07 NOTE — Progress Notes (Signed)
Robert Dyer Date of Birth:  Jun 14, 1961 [facility] [address] [place], [place]  [address] [phone]         Fax   [phone]  History of Present Illness: This pleasant 52 year old gentleman is seen for a scheduled four-month followup office visit. He has a history of ischemic heart disease. He had a non-STEMI in May 2012 treated with a cutting balloon PCI to the ramus. He has a history of LV dysfunction with an ejection fraction of 45%. He quit smoking at the time of his heart attack but then started back and is now smoking a half a pack a day against advice. The patient has not been experiencing any angina pectoris or symptoms of CHF.    Current Outpatient Prescriptions  Medication Sig Dispense Refill  . ALPRAZolam (XANAX) 0.25 MG tablet Take 1 tablet (0.25 mg total) by mouth 3 (three) times daily as needed for sleep.  90 tablet  3  . aspirin 81 MG tablet Take 81 mg by mouth daily.        . cephALEXin (KEFLEX) 500 MG capsule Take 500 mg by mouth 2 (two) times daily.      . clopidogrel (PLAVIX) 75 MG tablet Take 1 tablet (75 mg total) by mouth daily.  90 tablet  3  . lisinopril (PRINIVIL,ZESTRIL) 5 MG tablet Take 1 tablet (5 mg total) by mouth daily.  90 tablet  3  . metoprolol tartrate (LOPRESSOR) 25 MG tablet TAKE 1/2 TABLET BY MOUTH TWICE DAILY  30 tablet  9  . pantoprazole (PROTONIX) 40 MG tablet Take 1 tablet (40 mg total) by mouth daily.  30 tablet  5  . rosuvastatin (CRESTOR) 10 MG tablet Take 1 tablet (10 mg total) by mouth daily.  90 tablet  3  . nitroGLYCERIN (NITROSTAT) 0.4 MG SL tablet Place 1 tablet (0.4 mg total) under the tongue every 5 (five) minutes as needed for chest pain.  90 tablet  3  . varenicline (CHANTIX CONTINUING MONTH PAK) 1 MG tablet Take 1 tablet (1 mg total) by mouth 2 (two) times daily.  60 tablet  3  . varenicline (CHANTIX) 0.5 MG tablet 0.5 mg daily for three days then 0.5mg  twice a day for 4 days then switch to 1 mg pack   11 tablet  0   No current facility-administered medications for this visit.    Allergies  Allergen Reactions  . Statins     Patient Active Problem List  Diagnosis  . NSTEMI (non-ST elevated myocardial infarction)  . LV dysfunction  . Tobacco abuse  . Hyperlipidemia    History  Smoking status  . Current Every Day Smoker  . Types: Cigarettes  Smokeless tobacco  . Not on file    Comment: quit 2011    History  Alcohol Use  . Yes    Family History  Problem Relation Age of Onset  . Heart attack Mother   . Liver disease Father   . Kidney disease Father     Review of Systems: Constitutional: no fever chills diaphoresis or fatigue or change in weight.  Head and neck: no hearing loss, no epistaxis, no photophobia or visual disturbance. Respiratory: No cough, shortness of breath or wheezing. Cardiovascular: No chest pain peripheral edema, palpitations. Gastrointestinal: No abdominal distention, no abdominal pain, no change in bowel habits hematochezia or melena. Genitourinary: No dysuria, no frequency, no urgency, no nocturia. Musculoskeletal:No arthralgias, no back pain, no gait disturbance or myalgias. Neurological: No dizziness, no  headaches, no numbness, no seizures, no syncope, no weakness, no tremors. Hematologic: No lymphadenopathy, no easy bruising. Psychiatric: No confusion, no hallucinations, no sleep disturbance.    Physical Exam: Filed Vitals:   11/07/12 0913  BP: 112/68  Pulse: 68  Resp: 12   the general appearance reveals a well-developed well-nourished gentleman in no distress.  His weight is up 1 pound since last visit.The head and neck exam reveals pupils equal and reactive.  Extraocular movements are full.  There is no scleral icterus.  The mouth and pharynx are normal.  The neck is supple.  The carotids reveal no bruits.  The jugular venous pressure is normal.  The  thyroid is not enlarged.  There is no lymphadenopathy.  The chest is clear to  percussion and auscultation.  There are no rales or rhonchi.  Expansion of the chest is symmetrical.  The precordium is quiet.  The first heart sound is normal.  The second heart sound is physiologically split.  There is no murmur gallop rub or click.  There is no abnormal lift or heave.  The abdomen is soft and nontender.  The bowel sounds are normal.  The liver and spleen are not enlarged.  There are no abdominal masses.  There are no abdominal bruits.  Extremities reveal good pedal pulses.  There is no phlebitis or edema.  There is no cyanosis or clubbing.  Strength is normal and symmetrical in all extremities.  There is no lateralizing weakness.  There are no sensory deficits.  The skin is warm and dry.  There is no rash.  EKG shows sinus bradycardia and no ischemic changes.  Assessment / Plan: The patient is to continue same medication.  Add Chantix. Recheck in 4 months for followup office visit and fasting lab work

## 2012-11-07 NOTE — Assessment & Plan Note (Signed)
The patient has a history of mild left ventricular dysfunction with an ejection fraction of 45%.  Is not having symptoms of CHF

## 2012-11-07 NOTE — Assessment & Plan Note (Signed)
The patient is still smoking a half pack a day.  He wants to quit.  His wife is on Chantix.  She has been on it for about a week.  He is requesting Chantix for himself and we will order some for him.

## 2012-11-07 NOTE — Progress Notes (Signed)
Quick Note:  Please report to patient. The recent labs are stable. Continue same medication and careful diet. ______ 

## 2012-11-18 ENCOUNTER — Other Ambulatory Visit: Payer: Self-pay | Admitting: Cardiology

## 2012-11-18 MED ORDER — ALPRAZOLAM 0.25 MG PO TABS: 0.2500 mg | ORAL_TABLET | Freq: Three times a day (TID) | ORAL | Status: DC | PRN

## 2012-11-18 NOTE — Telephone Encounter (Signed)
This has been called in

## 2012-11-18 NOTE — Telephone Encounter (Signed)
Pt needs refill of xanax 0.25mg  [facility] said they requested Monday, pt now out needs asap

## 2013-01-17 ENCOUNTER — Other Ambulatory Visit: Payer: Self-pay | Admitting: *Deleted

## 2013-01-17 MED ORDER — PANTOPRAZOLE SODIUM 40 MG PO TBEC: 40.0000 mg | DELAYED_RELEASE_TABLET | Freq: Every day | ORAL | Status: DC

## 2013-02-28 ENCOUNTER — Other Ambulatory Visit: Payer: Self-pay

## 2013-02-28 DIAGNOSIS — I519 Heart disease, unspecified: Secondary | ICD-10-CM

## 2013-02-28 MED ORDER — METOPROLOL TARTRATE 25 MG PO TABS: ORAL_TABLET | ORAL | Status: DC

## 2013-02-28 MED ORDER — CLOPIDOGREL BISULFATE 75 MG PO TABS: 75.0000 mg | ORAL_TABLET | Freq: Every day | ORAL | Status: DC

## 2013-02-28 MED ORDER — ROSUVASTATIN CALCIUM 10 MG PO TABS: 10.0000 mg | ORAL_TABLET | Freq: Every day | ORAL | Status: DC

## 2013-03-21 ENCOUNTER — Other Ambulatory Visit: Payer: Self-pay | Admitting: Cardiology

## 2013-03-27 ENCOUNTER — Encounter: Payer: Self-pay | Admitting: Cardiology

## 2013-03-27 ENCOUNTER — Other Ambulatory Visit: Payer: BC Managed Care – PPO

## 2013-03-27 ENCOUNTER — Ambulatory Visit (INDEPENDENT_AMBULATORY_CARE_PROVIDER_SITE_OTHER): Payer: BC Managed Care – PPO | Admitting: Cardiology

## 2013-03-27 VITALS — BP 130/78 | HR 75 | Ht 70.0 in | Wt 189.8 lb

## 2013-03-27 DIAGNOSIS — I214 Non-ST elevation (NSTEMI) myocardial infarction: Secondary | ICD-10-CM

## 2013-03-27 DIAGNOSIS — I959 Hypotension, unspecified: Secondary | ICD-10-CM

## 2013-03-27 DIAGNOSIS — I259 Chronic ischemic heart disease, unspecified: Secondary | ICD-10-CM

## 2013-03-27 DIAGNOSIS — Z720 Tobacco use: Secondary | ICD-10-CM

## 2013-03-27 DIAGNOSIS — F17200 Nicotine dependence, unspecified, uncomplicated: Secondary | ICD-10-CM

## 2013-03-27 DIAGNOSIS — E785 Hyperlipidemia, unspecified: Secondary | ICD-10-CM

## 2013-03-27 NOTE — Assessment & Plan Note (Signed)
The patient has not had any recurrent chest pain or angina. 

## 2013-03-27 NOTE — Assessment & Plan Note (Signed)
The patient has cut back on his cigarette consumption.  Unfortunately his wife has decided that she is not going to try to quit.  The patient will continue to use Chantix as directed

## 2013-03-27 NOTE — Progress Notes (Signed)
Robert Dyer Date of Birth:  1960/12/25 [facility] [address] [place], [place]  [address] [phone]         Fax   [phone]  History of Present Illness: This pleasant 52 year old gentleman is seen for a scheduled four-month followup office visit. He has a history of ischemic heart disease. He had a non-STEMI in May 2012 treated with a cutting balloon PCI to the ramus. He has a history of LV dysfunction with an ejection fraction of 45%. He quit smoking at the time of his heart attack but then started back and is now smoking about 5 cigarettes a day against advice.  He has been using Chantix intermittently. The patient has not been experiencing any angina pectoris or symptoms of CHF.  He brought in a list of his blood pressures from work which shows that his blood pressure is low in the range of 90-100 systolic.  Current Outpatient Prescriptions  Medication Sig Dispense Refill  . ALPRAZolam (XANAX) 0.25 MG tablet TAKE 1 TABLET BY MOUTH 3 TIMES A DAY AS NEEDED  90 tablet  3  . aspirin 81 MG tablet Take 81 mg by mouth daily.        . clopidogrel (PLAVIX) 75 MG tablet Take 1 tablet (75 mg total) by mouth daily.  90 tablet  3  . metoprolol tartrate (LOPRESSOR) 25 MG tablet TAKE 1/2 TABLET BY MOUTH TWICE DAILY  90 tablet  3  . pantoprazole (PROTONIX) 40 MG tablet Take 1 tablet (40 mg total) by mouth daily.  30 tablet  5  . rosuvastatin (CRESTOR) 10 MG tablet Take 1 tablet (10 mg total) by mouth daily.  90 tablet  3  . varenicline (CHANTIX CONTINUING MONTH PAK) 1 MG tablet Take 1 tablet (1 mg total) by mouth 2 (two) times daily.  60 tablet  3  . nitroGLYCERIN (NITROSTAT) 0.4 MG SL tablet Place 1 tablet (0.4 mg total) under the tongue every 5 (five) minutes as needed for chest pain.  90 tablet  3   No current facility-administered medications for this visit.    Allergies  Allergen Reactions  . Statins     Patient Active Problem List   Diagnosis Date Noted  .  NSTEMI (non-ST elevated myocardial infarction) 02/23/2011  . LV dysfunction 02/23/2011  . Tobacco abuse 02/23/2011  . Hyperlipidemia 02/23/2011    History  Smoking status  . Current Every Day Smoker  . Types: Cigarettes  Smokeless tobacco  . Not on file    Comment: quit 2011    History  Alcohol Use  . Yes    Family History  Problem Relation Age of Onset  . Heart attack Mother   . Liver disease Father   . Kidney disease Father     Review of Systems: Constitutional: no fever chills diaphoresis or fatigue or change in weight.  Head and neck: no hearing loss, no epistaxis, no photophobia or visual disturbance. Respiratory: No cough, shortness of breath or wheezing. Cardiovascular: No chest pain peripheral edema, palpitations. Gastrointestinal: No abdominal distention, no abdominal pain, no change in bowel habits hematochezia or melena. Genitourinary: No dysuria, no frequency, no urgency, no nocturia. Musculoskeletal:No arthralgias, no back pain, no gait disturbance or myalgias. Neurological: No dizziness, no headaches, no numbness, no seizures, no syncope, no weakness, no tremors. Hematologic: No lymphadenopathy, no easy bruising. Psychiatric: No confusion, no hallucinations, no sleep disturbance.    Physical Exam: Filed Vitals:   03/27/13 1148  BP: 130/78  Pulse:  75   the general appearance reveals a well-developed well-nourished gentleman in no distress.  I rechecked his blood pressure after he had relaxed and it was back down to 100/70 today.The head and neck exam reveals pupils equal and reactive.  Extraocular movements are full.  There is no scleral icterus.  The mouth and pharynx are normal.  The neck is supple.  The carotids reveal no bruits.  The jugular venous pressure is normal.  The  thyroid is not enlarged.  There is no lymphadenopathy.  The chest is clear to percussion and auscultation.  There are no rales or rhonchi.  Expansion of the chest is symmetrical.  The  precordium is quiet.  The first heart sound is normal.  The second heart sound is physiologically split.  There is no murmur gallop rub or click.  There is no abnormal lift or heave.  The abdomen is soft and nontender.  The bowel sounds are normal.  The liver and spleen are not enlarged.  There are no abdominal masses.  There are no abdominal bruits.  Extremities reveal good pedal pulses.  There is no phlebitis or edema.  There is no cyanosis or clubbing.  Strength is normal and symmetrical in all extremities.  There is no lateralizing weakness.  There are no sensory deficits.  The skin is warm and dry.  There is no rash.     Assessment / Plan: Continue same medication except stop lisinopril.  We were unable to draw his blood work today.  He will return next Monday for fasting lab work.  Recheck in 4 months for followup office visit lipid panel hepatic function panel and basal metabolic panel.  I encouraged him in his efforts to quit smoking altogether.

## 2013-03-27 NOTE — Assessment & Plan Note (Signed)
Blood pressure has been running low at work.  The nurse at [facility] has been checking it.  Several of his readings are in the range of 90/50.  We will stop his 5 mg lisinopril and we will continue his present dose of beta blocker

## 2013-03-27 NOTE — Patient Instructions (Addendum)
Your physician has recommended you make the following change in your medication:   STOP LISINOPRIL DUE TO LOW BLOOD PRESSURE  Your physician recommends that you return for a FASTING lipid profile: Monday BMET LIPID LIVER  Your physician wants you to follow-up in: 4 MONTHS You will receive a reminder letter in the mail two months in advance. If you don't receive a letter, please call our office to schedule the follow-up appointment.

## 2013-04-03 ENCOUNTER — Other Ambulatory Visit (INDEPENDENT_AMBULATORY_CARE_PROVIDER_SITE_OTHER): Payer: BC Managed Care – PPO

## 2013-04-03 DIAGNOSIS — E785 Hyperlipidemia, unspecified: Secondary | ICD-10-CM

## 2013-04-03 DIAGNOSIS — I259 Chronic ischemic heart disease, unspecified: Secondary | ICD-10-CM

## 2013-04-03 LAB — HEPATIC FUNCTION PANEL
Albumin: 4 g/dL (ref 3.5–5.2)
Total Protein: 6.6 g/dL (ref 6.0–8.3)

## 2013-04-03 LAB — LIPID PANEL
Cholesterol: 109 mg/dL (ref 0–200)
LDL Cholesterol: 55 mg/dL (ref 0–99)
Triglycerides: 111 mg/dL (ref 0.0–149.0)
VLDL: 22.2 mg/dL (ref 0.0–40.0)

## 2013-04-03 LAB — BASIC METABOLIC PANEL
BUN: 11 mg/dL (ref 6–23)
CO2: 26 mEq/L (ref 19–32)
Chloride: 109 mEq/L (ref 96–112)
Potassium: 3.6 mEq/L (ref 3.5–5.1)

## 2013-04-03 NOTE — Progress Notes (Signed)
Quick Note:  Please report to patient. The recent labs are stable. Continue same medication and careful diet. ______ 

## 2013-04-05 ENCOUNTER — Telehealth: Payer: Self-pay | Admitting: *Deleted

## 2013-04-05 NOTE — Telephone Encounter (Signed)
Advised patient of lab results  

## 2013-04-05 NOTE — Telephone Encounter (Signed)
Message copied by Burnell Blanks on Wed Apr 05, 2013  9:41 AM ------      Message from: Cassell Clement      Created: Mon Apr 03, 2013  9:30 PM       Please report to patient.  The recent labs are stable. Continue same medication and careful diet. ------

## 2013-07-05 ENCOUNTER — Other Ambulatory Visit: Payer: Self-pay | Admitting: Cardiology

## 2013-07-17 ENCOUNTER — Telehealth: Payer: Self-pay | Admitting: Nurse Practitioner

## 2013-07-17 NOTE — Telephone Encounter (Signed)
New Problem:  Pt is requesting blood work with his appt. No orders in Epic. Please call pt back and let him know if he can have blood work the same morning of his appt.

## 2013-07-17 NOTE — Telephone Encounter (Signed)
We will plan on checking blood work at his appointment time He needs to be fasting.

## 2013-07-17 NOTE — Telephone Encounter (Signed)
S/w pt just wanted to know if pt was seeing the Np and having labs, I stated yes pt said ok

## 2013-07-18 ENCOUNTER — Other Ambulatory Visit: Payer: Self-pay | Admitting: Cardiology

## 2013-07-19 NOTE — Telephone Encounter (Signed)
New Problem  Pt called states that last night at work his face was really red// found that his BP was low// no heart attack symptoms.. Is this something to be concerned with?// Please call back

## 2013-07-19 NOTE — Telephone Encounter (Signed)
Called patient's refill for xanax 0.25mg  tid prn to patient's pharmacy. Called to notify refill called in to patient's pharmacy & to check on triage note regarding facial flushness with hypotension @ work last night. Pt reports B/P was  90/60 & denied experiencing c/p, SOB, weakness or dizziness. B/P 10 minutes after sitting down was 106/65 & continued to be asypmtomatic. Discussed with patient to take his B/P 2-3 x a week & call us back with readings. Patient indicated he would bring b/p readings to his office visit on Monday.

## 2013-07-24 ENCOUNTER — Ambulatory Visit (INDEPENDENT_AMBULATORY_CARE_PROVIDER_SITE_OTHER): Payer: BC Managed Care – PPO | Admitting: Nurse Practitioner

## 2013-07-24 ENCOUNTER — Encounter: Payer: Self-pay | Admitting: Nurse Practitioner

## 2013-07-24 VITALS — BP 120/80 | HR 63 | Ht 70.0 in | Wt 196.0 lb

## 2013-07-24 DIAGNOSIS — I2510 Atherosclerotic heart disease of native coronary artery without angina pectoris: Secondary | ICD-10-CM

## 2013-07-24 DIAGNOSIS — I519 Heart disease, unspecified: Secondary | ICD-10-CM

## 2013-07-24 LAB — LIPID PANEL
Cholesterol: 135 mg/dL (ref 0–200)
HDL: 32.4 mg/dL — ABNORMAL LOW (ref >39.00)
LDL Cholesterol: 77 mg/dL (ref 0–99)
Total CHOL/HDL Ratio: 4
Triglycerides: 130 mg/dL (ref 0.0–149.0)
VLDL: 26 mg/dL (ref 0.0–40.0)

## 2013-07-24 LAB — HEPATIC FUNCTION PANEL
ALT: 39 U/L (ref 0–53)
AST: 22 U/L (ref 0–37)
Albumin: 4.1 g/dL (ref 3.5–5.2)
Alkaline Phosphatase: 39 U/L (ref 39–117)
Bilirubin, Direct: 0 mg/dL (ref 0.0–0.3)
Total Bilirubin: 0.6 mg/dL (ref 0.3–1.2)
Total Protein: 6.9 g/dL (ref 6.0–8.3)

## 2013-07-24 LAB — BASIC METABOLIC PANEL
BUN: 14 mg/dL (ref 6–23)
CO2: 25 mEq/L (ref 19–32)
Calcium: 9.3 mg/dL (ref 8.4–10.5)
Chloride: 106 mEq/L (ref 96–112)
Creatinine, Ser: 1 mg/dL (ref 0.4–1.5)
GFR: 88.1 mL/min (ref >60.00)
Glucose, Bld: 98 mg/dL (ref 70–99)
Potassium: 4.2 mEq/L (ref 3.5–5.1)
Sodium: 138 mEq/L (ref 135–145)

## 2013-07-24 MED ORDER — NITROGLYCERIN 0.4 MG SL SUBL: 0.4000 mg | SUBLINGUAL_TABLET | SUBLINGUAL | Status: DC | PRN

## 2013-07-24 MED ORDER — VARENICLINE TARTRATE 0.5 MG X 11 & 1 MG X 42 PO MISC: ORAL | Status: DC

## 2013-07-24 NOTE — Patient Instructions (Signed)
[facility]  Here is your prescription for Chantix  I have sent a message to Dr. Patty Sermons about your Xanax  We will get an ultrasound of your heart  Stay on your current medicines  We will check labs today  See Dr. Patty Sermons in 6 months  Call the [facility] office at [phone] if you have any questions, problems or concerns.

## 2013-07-24 NOTE — Progress Notes (Signed)
Robert Dyer Date of Birth: 10/12/1960 Medical Record #[id number]  History of Present Illness: Mr. Setters is seen back today for a 4 month check. Seen for Dr. Patty Sermons. He has known CAD with NSTEMI in May of 2012 treated with cutting balloon PCI to the ramus. EF 45%. Other issues include tobacco abuse, HTN, HLD and GERD.  Last seen in July. BP was low - his ACE was stopped. He was still smoking some against medical advice.   Comes back today. Here alone. Doing ok. No chest pain. Not short of breath. Worried about his BP being too low but his readings from home are normal. Not dizzy or lightheaded. No swelling. Still smoking. Wants to try Chantix again. Needs his labs today. Wants his Xanax refilled today.   Current Outpatient Prescriptions  Medication Sig Dispense Refill  . ALPRAZolam (XANAX) 0.25 MG tablet TAKE 1 TABLET BY MOUTH 3 TIMES A DAY AS NEEDED  90 tablet  3  . aspirin 81 MG tablet Take 81 mg by mouth daily.        . clopidogrel (PLAVIX) 75 MG tablet Take 1 tablet (75 mg total) by mouth daily.  90 tablet  3  . metoprolol tartrate (LOPRESSOR) 25 MG tablet TAKE 1/2 TABLET BY MOUTH TWICE DAILY  90 tablet  3  . pantoprazole (PROTONIX) 40 MG tablet TAKE 1 TABLET (40 MG TOTAL) BY MOUTH DAILY.  30 tablet  0  . rosuvastatin (CRESTOR) 10 MG tablet Take 1 tablet (10 mg total) by mouth daily.  90 tablet  3  . nitroGLYCERIN (NITROSTAT) 0.4 MG SL tablet Place 1 tablet (0.4 mg total) under the tongue every 5 (five) minutes as needed for chest pain.  90 tablet  3  . varenicline (CHANTIX CONTINUING MONTH PAK) 1 MG tablet Take 1 tablet (1 mg total) by mouth 2 (two) times daily.  60 tablet  3   No current facility-administered medications for this visit.    Allergies  Allergen Reactions  . Statins     Past Medical History  Diagnosis Date  . MI, acute, non ST segment elevation     s/p cutting balloon PTCA to the ramus intermedius  . GERD (gastroesophageal reflux disease)   .  Hyperlipidemia   . Hypertension   . Coronary artery disease   . Tobacco abuse, in remission     Past Surgical History  Procedure Laterality Date  . Hemorroidectomy    . Cardiac catheterization  02/03/2011    revealing a total occlusion of the mid first diagonal with a 99% ostial stenosis of the ramus intermedius, and otherwise nonobstructive  disease.  EF was 45% with distal anterolateral akinesis.  The first  diagonal was felt to be the infarct vessel that was felt that it was  likely occluded for some time and was not likely to benefit with  reperfusion.    . Coronary angioplasty  02/03/2011    s/p cutting balloon PTCA to the ramus intermediate    History  Smoking status  . Current Every Day Smoker  . Types: Cigarettes  Smokeless tobacco  . Not on file    Comment: quit 2011    History  Alcohol Use  . Yes    Family History  Problem Relation Age of Onset  . Heart attack Mother   . Liver disease Father   . Kidney disease Father     Review of Systems: The review of systems is per the HPI.  All other systems were reviewed  and are negative.  Physical Exam: BP 120/80  Pulse 63  Ht 5\' 10"  (1.778 m)  Wt 196 lb (88.905 kg)  BMI 28.12 kg/m2  SpO2 98% Patient is very pleasant and in no acute distress. Smells of tobacco. Skin is warm and dry. Color is normal.  HEENT is unremarkable. Normocephalic/atraumatic. PERRL. Sclera are nonicteric. Neck is supple. No masses. No JVD. Lungs are clear. Cardiac exam shows a regular rate and rhythm.Heart tones are distant.  Abdomen is soft. Extremities are without edema. Gait and ROM are intact. No gross neurologic deficits noted.  LABORATORY DATA: PENDING  Lab Results  Component Value Date   WBC 11.6* 02/04/2011   HGB 15.3 02/04/2011   HCT 44.2 02/04/2011   PLT 296 02/04/2011   GLUCOSE 73 04/03/2013   CHOL 109 04/03/2013   TRIG 111.0 04/03/2013   HDL 31.40* 04/03/2013   LDLCALC 55 04/03/2013   ALT 30 04/03/2013   AST 20 04/03/2013   NA 140  04/03/2013   K 3.6 04/03/2013   CL 109 04/03/2013   CREATININE 1.1 04/03/2013   BUN 11 04/03/2013   CO2 26 04/03/2013   TSH 1.838 02/01/2011   INR 1.04 02/01/2011   HGBA1C  Value: 5.5 (NOTE)                                                                       According to the ADA Clinical Practice Recommendations for 2011, when HbA1c is used as a screening test:   >=6.5%   Diagnostic of Diabetes Mellitus           (if abnormal result  is confirmed)  5.7-6.4%   Increased risk of developing Diabetes Mellitus  References:Diagnosis and Classification of Diabetes Mellitus,Diabetes Care,2011,34(Suppl 1):S62-S69 and Standards of Medical Care in         Diabetes - 2011,Diabetes Care,2011,34  (Suppl 1):S11-S61. 02/01/2011   ANGIOGRAPHIC DATA FROM 2012:  The right coronary artery is a small nondominant  vessel and appears normal.  The left main coronary artery is very short and appears normal.  The left anterior descending artery has 20-30% disease in the proximal  vessel and then extends to the apex without significant disease. The  first diagonal branch has a 100% occlusion of the midvessel.  The ramus intermediate branch has a 99% ostial stenosis.  The left circumflex coronary artery is a large dominant vessel that  gives rise to several marginal vessels before terminating in the PDA.  He has diffuse irregularities less than or equal to 20%. The left PDA  has a 60%-70% stenosis.  Left ventricular angiography performed in the RAO view demonstrates  distal anterolateral akinesis with overall ejection fraction of 45%.  We proceeded with percutaneous intervention of the intermediate branch.  It was felt that the diagonal branch was his infarct vessel, but that  this had been occluded for sometime and was not likely to benefit with  reperfusion. After anticoagulation, we were able to cross the  intermediate with a Prowater wire. We predilated the lesion with a 2.0  x 12-mm apex balloon to 10 atmospheres. We  then placed a 2.5 x 10-mm  cutting balloon and performed 2 inflations to 6 atmospheres. This  yielded an excellent angiographic result with  0% residual stenosis and  TIMI grade 3 flow.  FINAL INTERPRETATION:  1. Two-vessel obstructive atherosclerotic coronary artery disease. His infarct vessel was the first diagonal branch which is occluded  in the midvessel.  2. Mild left ventricular dysfunction.  3. Successful cutting balloon angioplasty of the ramus intermediate  branch.  PLAN: I would continue on aspirin and Plavix given his recent infarct.  ______________________________  Peter M. [place], M.D.  PMJ/MEDQ D: 02/03/2011 T: 02/04/2011 Job: [id number]  Assessment / Plan: 1. CAD with past NSTEMI with cutting balloon PCI to the ramus from May of 2012 - no recurrent symptoms  2. Ongoing tobacco abuse - wanting to try Chantix again - has tolerated in the past  3. HTN - BP is normal - no need to change medicine  4. LV dysfunction - no symptoms - but needs echo to see where we are with his LV dysfunction. Currently asymptomatic.  5. HLD - on statin - checking labs today  His NTG is refilled. I have given him a prescription for Chantix. Defer the Xanax to Dr. Patty Sermons.  Patient is agreeable to this plan and will call if any problems develop in the interim.   Rosalio Macadamia, RN, ANP-C [facility] [address] [place], [place]  [address]

## 2013-07-26 ENCOUNTER — Telehealth: Payer: Self-pay | Admitting: *Deleted

## 2013-07-26 NOTE — Telephone Encounter (Signed)
Message copied by Burnell Blanks on Wed Jul 26, 2013  4:58 PM ------      Message from: Rosalio Macadamia      Created: Tue Jul 25, 2013  8:10 AM       Can you see if Dr. B refilled the Xanax? I did not.            Thanks      lori ------

## 2013-07-27 ENCOUNTER — Ambulatory Visit: Payer: BC Managed Care – PPO | Admitting: Cardiology

## 2013-08-02 ENCOUNTER — Ambulatory Visit (HOSPITAL_COMMUNITY): Payer: BC Managed Care – PPO | Attending: Cardiovascular Disease | Admitting: Radiology

## 2013-08-02 ENCOUNTER — Encounter: Payer: Self-pay | Admitting: Cardiovascular Disease

## 2013-08-02 DIAGNOSIS — I252 Old myocardial infarction: Secondary | ICD-10-CM | POA: Insufficient documentation

## 2013-08-02 DIAGNOSIS — F17200 Nicotine dependence, unspecified, uncomplicated: Secondary | ICD-10-CM | POA: Insufficient documentation

## 2013-08-02 DIAGNOSIS — I519 Heart disease, unspecified: Secondary | ICD-10-CM

## 2013-08-02 DIAGNOSIS — I2510 Atherosclerotic heart disease of native coronary artery without angina pectoris: Secondary | ICD-10-CM

## 2013-08-02 DIAGNOSIS — E785 Hyperlipidemia, unspecified: Secondary | ICD-10-CM | POA: Insufficient documentation

## 2013-08-02 DIAGNOSIS — I079 Rheumatic tricuspid valve disease, unspecified: Secondary | ICD-10-CM | POA: Insufficient documentation

## 2013-08-02 NOTE — Progress Notes (Signed)
Echocardiogram performed.  

## 2013-08-05 ENCOUNTER — Other Ambulatory Visit: Payer: Self-pay | Admitting: Cardiology

## 2013-08-08 ENCOUNTER — Telehealth: Payer: Self-pay | Admitting: *Deleted

## 2013-08-08 NOTE — Telephone Encounter (Signed)
Advised of echo results 

## 2013-08-08 NOTE — Telephone Encounter (Signed)
Message copied by Burnell Blanks on Tue Aug 08, 2013  5:39 PM ------      Message from: Cassell Clement      Created: Sun Aug 06, 2013  6:28 PM       Please report. LV function is EF about the same as last time (was 45%, now is 45-50%).  Continue same meds. ------

## 2013-08-21 ENCOUNTER — Other Ambulatory Visit: Payer: Self-pay | Admitting: Nurse Practitioner

## 2013-08-22 ENCOUNTER — Other Ambulatory Visit: Payer: Self-pay | Admitting: *Deleted

## 2013-08-22 MED ORDER — VARENICLINE TARTRATE 1 MG PO TABS: 1.0000 mg | ORAL_TABLET | Freq: Two times a day (BID) | ORAL | Status: DC

## 2013-08-22 NOTE — Telephone Encounter (Signed)
Lawson Fiscal, do you want this refilled?

## 2013-11-10 ENCOUNTER — Telehealth: Payer: Self-pay | Admitting: Cardiology

## 2013-11-12 ENCOUNTER — Other Ambulatory Visit: Payer: Self-pay | Admitting: Cardiology

## 2013-11-13 ENCOUNTER — Telehealth: Payer: Self-pay | Admitting: *Deleted

## 2013-11-13 NOTE — Telephone Encounter (Signed)
Received call from [facility] regarding CRESTOR, needs PA, done over the phone and approved through 11/13/2014

## 2013-11-15 ENCOUNTER — Encounter: Payer: Self-pay | Admitting: *Deleted

## 2013-11-15 NOTE — Telephone Encounter (Signed)
This encounter was created in error - please disregard.

## 2013-11-17 ENCOUNTER — Telehealth: Payer: Self-pay

## 2013-11-17 DIAGNOSIS — F419 Anxiety disorder, unspecified: Secondary | ICD-10-CM

## 2013-11-21 MED ORDER — ALPRAZOLAM 0.25 MG PO TABS: ORAL_TABLET | ORAL | Status: DC

## 2013-11-21 NOTE — Telephone Encounter (Signed)
Will fax as requested

## 2013-11-21 NOTE — Telephone Encounter (Signed)
Printed and [facility] after  Dr. Patty SermonsBrackbill signs

## 2013-12-13 ENCOUNTER — Encounter: Payer: Self-pay | Admitting: Cardiology

## 2013-12-22 ENCOUNTER — Telehealth: Payer: Self-pay | Admitting: Cardiology

## 2013-12-22 DIAGNOSIS — E7800 Pure hypercholesterolemia, unspecified: Secondary | ICD-10-CM

## 2013-12-22 NOTE — Telephone Encounter (Signed)
Orders placed.

## 2013-12-22 NOTE — Telephone Encounter (Signed)
New Prob    Pt requesting fasting lab for appt scheduled 6/15. Orders needed in EPIC.

## 2013-12-28 ENCOUNTER — Other Ambulatory Visit: Payer: Self-pay | Admitting: *Deleted

## 2013-12-28 MED ORDER — METOPROLOL TARTRATE 25 MG PO TABS: ORAL_TABLET | ORAL | Status: DC

## 2013-12-28 MED ORDER — PANTOPRAZOLE SODIUM 40 MG PO TBEC: DELAYED_RELEASE_TABLET | ORAL | Status: DC

## 2014-02-19 ENCOUNTER — Other Ambulatory Visit: Payer: BC Managed Care – PPO

## 2014-02-19 ENCOUNTER — Ambulatory Visit: Payer: BC Managed Care – PPO | Admitting: Cardiology

## 2014-02-19 ENCOUNTER — Other Ambulatory Visit (INDEPENDENT_AMBULATORY_CARE_PROVIDER_SITE_OTHER): Payer: BC Managed Care – PPO

## 2014-02-19 DIAGNOSIS — E7800 Pure hypercholesterolemia, unspecified: Secondary | ICD-10-CM

## 2014-02-19 LAB — BASIC METABOLIC PANEL
BUN: 14 mg/dL (ref 6–23)
CO2: 25 mEq/L (ref 19–32)
Calcium: 9 mg/dL (ref 8.4–10.5)
Chloride: 108 mEq/L (ref 96–112)
Creatinine, Ser: 1.1 mg/dL (ref 0.4–1.5)
GFR: 73.46 mL/min (ref >60.00)
Glucose, Bld: 112 mg/dL — ABNORMAL HIGH (ref 70–99)
POTASSIUM: 4.3 meq/L (ref 3.5–5.1)
SODIUM: 140 meq/L (ref 135–145)

## 2014-02-19 LAB — LIPID PANEL
Cholesterol: 126 mg/dL (ref 0–200)
HDL: 33.2 mg/dL — AB (ref >39.00)
LDL CALC: 75 mg/dL (ref 0–99)
NONHDL: 92.8
Total CHOL/HDL Ratio: 4
Triglycerides: 89 mg/dL (ref 0.0–149.0)
VLDL: 17.8 mg/dL (ref 0.0–40.0)

## 2014-02-19 LAB — HEPATIC FUNCTION PANEL
ALBUMIN: 4.1 g/dL (ref 3.5–5.2)
ALT: 36 U/L (ref 0–53)
AST: 23 U/L (ref 0–37)
Alkaline Phosphatase: 35 U/L — ABNORMAL LOW (ref 39–117)
Bilirubin, Direct: 0 mg/dL (ref 0.0–0.3)
Total Bilirubin: 0.2 mg/dL (ref 0.2–1.2)
Total Protein: 6.6 g/dL (ref 6.0–8.3)

## 2014-02-19 NOTE — Progress Notes (Signed)
Quick Note:  Please make copy of labs for patient visit. ______ 

## 2014-02-20 ENCOUNTER — Encounter: Payer: Self-pay | Admitting: Cardiology

## 2014-02-20 ENCOUNTER — Ambulatory Visit (INDEPENDENT_AMBULATORY_CARE_PROVIDER_SITE_OTHER): Payer: BC Managed Care – PPO | Admitting: Cardiology

## 2014-02-20 ENCOUNTER — Ambulatory Visit: Payer: BC Managed Care – PPO | Admitting: Cardiology

## 2014-02-20 VITALS — BP 130/86 | HR 72 | Ht 70.0 in | Wt 202.0 lb

## 2014-02-20 DIAGNOSIS — Z720 Tobacco use: Secondary | ICD-10-CM

## 2014-02-20 DIAGNOSIS — E7800 Pure hypercholesterolemia, unspecified: Secondary | ICD-10-CM

## 2014-02-20 DIAGNOSIS — I214 Non-ST elevation (NSTEMI) myocardial infarction: Secondary | ICD-10-CM

## 2014-02-20 DIAGNOSIS — E785 Hyperlipidemia, unspecified: Secondary | ICD-10-CM

## 2014-02-20 DIAGNOSIS — I519 Heart disease, unspecified: Secondary | ICD-10-CM

## 2014-02-20 DIAGNOSIS — I2510 Atherosclerotic heart disease of native coronary artery without angina pectoris: Secondary | ICD-10-CM

## 2014-02-20 DIAGNOSIS — F17200 Nicotine dependence, unspecified, uncomplicated: Secondary | ICD-10-CM

## 2014-02-20 MED ORDER — ROSUVASTATIN CALCIUM 10 MG PO TABS: 10.0000 mg | ORAL_TABLET | Freq: Every day | ORAL | Status: DC

## 2014-02-20 MED ORDER — VARENICLINE TARTRATE 0.5 MG X 11 & 1 MG X 42 PO MISC: 0.5000 mg | ORAL | Status: DC

## 2014-02-20 MED ORDER — VARENICLINE TARTRATE 1 MG PO TABS: 1.0000 mg | ORAL_TABLET | Freq: Two times a day (BID) | ORAL | Status: DC

## 2014-02-20 MED ORDER — METOPROLOL TARTRATE 25 MG PO TABS: ORAL_TABLET | ORAL | Status: DC

## 2014-02-20 MED ORDER — CLOPIDOGREL BISULFATE 75 MG PO TABS: 75.0000 mg | ORAL_TABLET | Freq: Every day | ORAL | Status: DC

## 2014-02-20 NOTE — Progress Notes (Signed)
Robert Dyer Date of Birth:  08/11/1961 [facility] [address] [place], [place]  [id number] [phone]        Fax   [phone]   History of Present Illness: This pleasant 53 year old gentleman is seen for a scheduled six-month followup office visit. He has a history of ischemic heart disease. He had a non-STEMI in May 2012 treated with a cutting balloon PCI to the ramus. He has a history of LV dysfunction with an ejection fraction of 45%. He quit smoking at the time of his heart attack but then started back and is now smoking about 5 cigarettes a day against advice. He has been using Chantix intermittently. The patient has not been experiencing any angina pectoris or symptoms of CHF.  After his last office visit he had an echocardiogram on 08/02/13 which showed slight improvement in left ventricular function with his ejection fraction now 45-50%.  There was no aortic insufficiency or mitral regurgitation.  Current Outpatient Prescriptions  Medication Sig Dispense Refill  . ALPRAZolam (XANAX) 0.25 MG tablet TAKE 1 TABLET BY MOUTH 3 TIMES A DAY AS NEEDED  270 tablet  1  . aspirin 81 MG tablet Take 81 mg by mouth daily.        . clopidogrel (PLAVIX) 75 MG tablet Take 1 tablet (75 mg total) by mouth daily.  90 tablet  3  . metoprolol tartrate (LOPRESSOR) 25 MG tablet TAKE 1/2 TABLET BY MOUTH TWICE DAILY  90 tablet  2  . nitroGLYCERIN (NITROSTAT) 0.4 MG SL tablet Place 1 tablet (0.4 mg total) under the tongue every 5 (five) minutes as needed for chest pain.  25 tablet  3  . pantoprazole (PROTONIX) 40 MG tablet TAKE 1 TABLET (40 MG TOTAL) BY MOUTH DAILY.  90 tablet  0  . rosuvastatin (CRESTOR) 10 MG tablet Take 1 tablet (10 mg total) by mouth daily.  90 tablet  3  . varenicline (CHANTIX CONTINUING MONTH PAK) 1 MG tablet Take 1 tablet (1 mg total) by mouth 2 (two) times daily.  60 tablet  1  . varenicline (CHANTIX STARTING MONTH PAK) 0.5 MG X 11 & 1 MG X 42 tablet  Take 0.5 mg by mouth as directed. Take one 0.5 mg tablet by mouth once daily for 3 days, then increase to one 0.5 mg tablet twice daily for 4 days, then increase to one 1 mg tablet twice daily.  53 tablet  0   No current facility-administered medications for this visit.    Allergies  Allergen Reactions  . Statins     Patient Active Problem List   Diagnosis Date Noted  . Low blood pressure 03/27/2013  . NSTEMI (non-ST elevated myocardial infarction) 02/23/2011  . LV dysfunction 02/23/2011  . Tobacco abuse 02/23/2011  . Hyperlipidemia 02/23/2011    History  Smoking status  . Current Every Day Smoker  . Types: Cigarettes  Smokeless tobacco  . Not on file    Comment: quit 2011    History  Alcohol Use  . Yes    Family History  Problem Relation Age of Onset  . Heart attack Mother   . Liver disease Father   . Kidney disease Father     Review of Systems: Constitutional: no fever chills diaphoresis or fatigue or change in weight.  Head and neck: no hearing loss, no epistaxis, no photophobia or visual disturbance. Respiratory: No cough, shortness of breath or wheezing. Cardiovascular: No chest pain peripheral edema, palpitations.  Gastrointestinal: No abdominal distention, no abdominal pain, no change in bowel habits hematochezia or melena. Genitourinary: No dysuria, no frequency, no urgency, no nocturia. Musculoskeletal:No arthralgias, no back pain, no gait disturbance or myalgias. Neurological: No dizziness, no headaches, no numbness, no seizures, no syncope, no weakness, no tremors. Hematologic: No lymphadenopathy, no easy bruising. Psychiatric: No confusion, no hallucinations, no sleep disturbance.    Physical Exam: Filed Vitals:   02/20/14 1035  BP: 130/86  Pulse: 72   general appearance reveals a well-developed well-nourished gentleman in no distress.The head and neck exam reveals pupils equal and reactive.  Extraocular movements are full.  There is no scleral  icterus.  The mouth and pharynx are normal.  The neck is supple.  The carotids reveal no bruits.  The jugular venous pressure is normal.  The  thyroid is not enlarged.  There is no lymphadenopathy.  The chest is clear to percussion and auscultation.  There are no rales or rhonchi.  Expansion of the chest is symmetrical.  The precordium is quiet.  The first heart sound is normal.  The second heart sound is physiologically split.  There is no murmur gallop rub or click.  There is no abnormal lift or heave.  The abdomen is soft and nontender.  The bowel sounds are normal.  The liver and spleen are not enlarged.  There are no abdominal masses.  There are no abdominal bruits.  Extremities reveal good pedal pulses.  There is no phlebitis or edema.  There is no cyanosis or clubbing.  Strength is normal and symmetrical in all extremities.  There is no lateralizing weakness.  There are no sensory deficits.  The skin is warm and dry.  There is no rash.     Assessment / Plan: 1.  Ischemic heart disease status post non-STEMI in May of 2012 treated with Cutting Balloon PCI to the ramus. 2. chronic left ventricular systolic dysfunction with ejection fraction 45-50% 3. hypertensive heart disease without heart failure 4. tobacco abuse 5. Hyperlipidemia 6. GERD  Plan: Continue same medication.  Restart Chantix.  Discussed the importance of goal of quitting smoking permanently this time Continue same medication and be rechecked in 6 months for office visit lipid panel hepatic function panel and basal metabolic panel.  We refilled his cardiac meds today.

## 2014-02-20 NOTE — Assessment & Plan Note (Signed)
Patient has a history of hyperlipidemia.  We reviewed his labs which are satisfactory.  He is not having any side effects from the Crestor.

## 2014-02-20 NOTE — Patient Instructions (Signed)
Your physician recommends that you continue on your current medications as directed. Please refer to the Current Medication list given to you today. [facility]; plavix, metoprolol, and crest[facility].    Your physician wants you to follow-up in: 6 month with labs. You will receive a reminder letter in the mail two months in advance. If you don't receive a letter, please call our office to schedule the follow-up appointment @ [phone].

## 2014-02-20 NOTE — Assessment & Plan Note (Signed)
The patient has not had any recurrent chest pain or angina.  He has not had to take any nitroglycerin.

## 2014-02-20 NOTE — Assessment & Plan Note (Signed)
The patient is requesting that we put him back on Chantix which we did.  We talked about how important it is for him to quit smoking altogether for the long-term health of his vascular tree.

## 2014-02-20 NOTE — Assessment & Plan Note (Signed)
The patient has not had any orthopnea or paroxysmal nocturnal dyspnea or pedal edema

## 2014-03-15 ENCOUNTER — Other Ambulatory Visit: Payer: Self-pay | Admitting: Cardiology

## 2014-04-09 ENCOUNTER — Other Ambulatory Visit: Payer: Self-pay | Admitting: Cardiology

## 2014-04-09 DIAGNOSIS — Z720 Tobacco use: Secondary | ICD-10-CM

## 2014-04-20 ENCOUNTER — Other Ambulatory Visit: Payer: Self-pay | Admitting: *Deleted

## 2014-04-20 DIAGNOSIS — F419 Anxiety disorder, unspecified: Secondary | ICD-10-CM

## 2014-04-20 MED ORDER — ALPRAZOLAM 0.25 MG PO TABS: ORAL_TABLET | ORAL | Status: DC

## 2014-04-20 NOTE — Telephone Encounter (Signed)
[facility]

## 2014-05-02 NOTE — Telephone Encounter (Signed)
error 

## 2014-05-21 ENCOUNTER — Other Ambulatory Visit: Payer: Self-pay | Admitting: *Deleted

## 2014-06-28 ENCOUNTER — Other Ambulatory Visit: Payer: Self-pay | Admitting: Cardiology

## 2014-06-28 DIAGNOSIS — Z720 Tobacco use: Secondary | ICD-10-CM

## 2014-08-27 ENCOUNTER — Ambulatory Visit: Payer: BC Managed Care – PPO | Admitting: Cardiology

## 2014-08-27 ENCOUNTER — Other Ambulatory Visit: Payer: BC Managed Care – PPO

## 2014-09-07 ENCOUNTER — Other Ambulatory Visit: Payer: Self-pay | Admitting: Cardiology

## 2014-10-08 ENCOUNTER — Ambulatory Visit: Payer: BC Managed Care – PPO | Admitting: Cardiology

## 2014-10-08 ENCOUNTER — Other Ambulatory Visit: Payer: BC Managed Care – PPO

## 2014-10-15 ENCOUNTER — Ambulatory Visit (INDEPENDENT_AMBULATORY_CARE_PROVIDER_SITE_OTHER): Payer: BLUE CROSS/BLUE SHIELD | Admitting: Cardiology

## 2014-10-15 ENCOUNTER — Encounter: Payer: Self-pay | Admitting: Cardiology

## 2014-10-15 ENCOUNTER — Other Ambulatory Visit (INDEPENDENT_AMBULATORY_CARE_PROVIDER_SITE_OTHER): Payer: BLUE CROSS/BLUE SHIELD | Admitting: *Deleted

## 2014-10-15 VITALS — BP 132/84 | HR 76 | Ht 70.0 in | Wt 202.0 lb

## 2014-10-15 DIAGNOSIS — I259 Chronic ischemic heart disease, unspecified: Secondary | ICD-10-CM

## 2014-10-15 DIAGNOSIS — E7800 Pure hypercholesterolemia, unspecified: Secondary | ICD-10-CM

## 2014-10-15 DIAGNOSIS — Z720 Tobacco use: Secondary | ICD-10-CM

## 2014-10-15 DIAGNOSIS — I519 Heart disease, unspecified: Secondary | ICD-10-CM

## 2014-10-15 LAB — HEPATIC FUNCTION PANEL
ALBUMIN: 4.3 g/dL (ref 3.5–5.2)
ALT: 29 U/L (ref 0–53)
AST: 19 U/L (ref 0–37)
Alkaline Phosphatase: 41 U/L (ref 39–117)
BILIRUBIN DIRECT: 0 mg/dL (ref 0.0–0.3)
Total Bilirubin: 0.2 mg/dL (ref 0.2–1.2)
Total Protein: 6.6 g/dL (ref 6.0–8.3)

## 2014-10-15 LAB — BASIC METABOLIC PANEL
BUN: 16 mg/dL (ref 6–23)
CHLORIDE: 105 meq/L (ref 96–112)
CO2: 24 meq/L (ref 19–32)
Calcium: 9 mg/dL (ref 8.4–10.5)
Creatinine, Ser: 1 mg/dL (ref 0.40–1.50)
GFR: 82.66 mL/min (ref >60.00)
Glucose, Bld: 84 mg/dL (ref 70–99)
POTASSIUM: 3.9 meq/L (ref 3.5–5.1)
SODIUM: 137 meq/L (ref 135–145)

## 2014-10-15 LAB — LIPID PANEL
CHOL/HDL RATIO: 4
CHOLESTEROL: 143 mg/dL (ref 0–200)
HDL: 35.2 mg/dL — ABNORMAL LOW (ref >39.00)
LDL Cholesterol: 88 mg/dL (ref 0–99)
NonHDL: 107.8
TRIGLYCERIDES: 98 mg/dL (ref 0.0–149.0)
VLDL: 19.6 mg/dL (ref 0.0–40.0)

## 2014-10-15 MED ORDER — VARENICLINE TARTRATE 1 MG PO TABS: ORAL_TABLET | ORAL | Status: DC

## 2014-10-15 NOTE — Patient Instructions (Signed)
Will obtain labs today and call you with the results (LP.BMET/HFP)  Your physician wants you to follow-up in: 6 months with fasting labs (lp/bmet/hfp) AND EKG You will receive a reminder letter in the mail two months in advance. If you don't receive a letter, please call our office to schedule the follow-up appointment.

## 2014-10-15 NOTE — Progress Notes (Signed)
Cardiology Office Note   Date:  10/15/2014   ID:  Robert Dyer, DOB Mar 10, 1961, MRN [id number]  PCP:  No primary care provider on file.  Cardiologist:   Cassell Clement, MD   No chief complaint on file.     History of Present Illness: Robert Dyer is a 54 y.o. male who presents for a six-month follow-up office visit.  This pleasant 54 year old gentleman is seen for a scheduled six-month followup office visit. He has a history of ischemic heart disease. He had a non-STEMI in May 2012 treated with a cutting balloon PCI to the ramus. He has a history of LV dysfunction with an ejection fraction of 45%. He quit smoking at the time of his heart attack but then started back and is now smoking about 5 cigarettes a day against advice. He has been using Chantix intermittently. The patient has not been experiencing any angina pectoris or symptoms of CHF. After his last office visit he had an echocardiogram on 08/02/13 which showed slight improvement in left ventricular function with his ejection fraction now 45-50%. There was no aortic insufficiency or mitral regurgitation. Since last visit he denies any chest pain.  He has not had to take any nitroglycerin.  He works nights.  He sleeps during the daytime. Since we last saw him he has had a bad chest cold but went to urgent care and received ampicillin and steroids and has improved.  Past Medical History  Diagnosis Date  . MI, acute, non ST segment elevation     s/p cutting balloon PTCA to the ramus intermedius  . GERD (gastroesophageal reflux disease)   . Hyperlipidemia   . Hypertension   . Coronary artery disease   . Tobacco abuse, in remission     Past Surgical History  Procedure Laterality Date  . Hemorroidectomy    . Cardiac catheterization  02/03/2011    revealing a total occlusion of the mid first diagonal with a 99% ostial stenosis of the ramus intermedius, and otherwise nonobstructive  disease.  EF was 45% with distal  anterolateral akinesis.  The first  diagonal was felt to be the infarct vessel that was felt that it was  likely occluded for some time and was not likely to benefit with  reperfusion.    . Coronary angioplasty  02/03/2011    s/p cutting balloon PTCA to the ramus intermediate     Current Outpatient Prescriptions  Medication Sig Dispense Refill  . ALPRAZolam (XANAX) 0.25 MG tablet TAKE 1 TABLET BY MOUTH 3 TIMES A DAY AS NEEDED 270 tablet 1  . aspirin 81 MG tablet Take 81 mg by mouth daily.      . clopidogrel (PLAVIX) 75 MG tablet Take 1 tablet (75 mg total) by mouth daily. 90 tablet 3  . metoprolol tartrate (LOPRESSOR) 25 MG tablet TAKE 1/2 TABLET BY MOUTH TWICE DAILY 90 tablet 2  . pantoprazole (PROTONIX) 40 MG tablet TAKE 1 TABLET DAILY 90 tablet 0  . rosuvastatin (CRESTOR) 10 MG tablet Take 1 tablet (10 mg total) by mouth daily. 90 tablet 3  . varenicline (CHANTIX) 1 MG tablet TAKE 1 TABLET (1 MG TOTAL) BY MOUTH 2 (TWO) TIMES DAILY. 180 tablet 1  . nitroGLYCERIN (NITROSTAT) 0.4 MG SL tablet Place 1 tablet (0.4 mg total) under the tongue every 5 (five) minutes as needed for chest pain. 25 tablet 3   No current facility-administered medications for this visit.    Allergies:   Statins    Social  History:  The patient  reports that he has been smoking Cigarettes.  He does not have any smokeless tobacco history on file. He reports that he drinks alcohol. He reports that he uses illicit drugs (Marijuana).   Family History:  The patient's family history includes Heart attack in his mother; Kidney disease in his father; Liver disease in his father.    ROS:  Please see the history of present illness.   Otherwise, review of systems are positive for none.   All other systems are reviewed and negative.    PHYSICAL EXAM: VS:  BP 132/84 mmHg  Pulse 76  Ht 5\' 10"  (1.778 m)  Wt 202 lb (91.627 kg)  BMI 28.98 kg/m2 , BMI Body mass index is 28.98 kg/(m^2). GEN: Well nourished, well developed, in no  acute distress HEENT: normal Neck: no JVD, carotid bruits, or masses Cardiac: RRR; no murmurs, rubs, or gallops,no edema  Respiratory:  clear to auscultation bilaterally, normal work of breathing GI: soft, nontender, nondistended, + BS MS: no deformity or atrophy Skin: warm and dry, no rash Neuro:  Strength and sensation are intact Psych: euthymic mood, full affect   EKG:  EKG is not ordered today.    Recent Labs: 02/19/2014: ALT 36; BUN 14; Creatinine 1.1; Potassium 4.3; Sodium 140    Lipid Panel    Component Value Date/Time   CHOL 126 02/19/2014 0839   TRIG 89.0 02/19/2014 0839   HDL 33.20* 02/19/2014 0839   CHOLHDL 4 02/19/2014 0839   VLDL 17.8 02/19/2014 0839   LDLCALC 75 02/19/2014 0839      Wt Readings from Last 3 Encounters:  10/15/14 202 lb (91.627 kg)  02/20/14 202 lb (91.627 kg)  07/24/13 196 lb (88.905 kg)      Other studies Reviewed:    ASSESSMENT AND PLAN:  1. Ischemic heart disease status post non-STEMI in May of 2012 treated with Cutting Balloon PCI to the ramus. 2. chronic left ventricular systolic dysfunction with ejection fraction 45-50% 3. hypertensive heart disease without heart failure 4. tobacco abuse 5. Hyperlipidemia 6. GERD   Current medicines are reviewed at length with the patient today.  The patient does not have concerns regarding medicines.  The following changes have been made:  no change    Orders Placed This Encounter  Procedures  . Lipid panel  . Hepatic function panel  . Basic metabolic panel  . Lipid panel  . Hepatic function panel  . Basic metabolic panel     Disposition:   FU with Dr. Patty SermonsBrackbill in 6 months for office visit, EKG, lipid panel, hepatic function panel, and basal metabolic panel.   Signed, Cassell Clementhomas Bram Hottel, MD  10/15/2014 9:21 AM    [facility] [address], [place], [place]  [address] Phone: [phone]; Fax: [phone]

## 2014-10-16 NOTE — Progress Notes (Signed)
Quick Note:  Please report to patient. The recent labs are stable. Continue same medication and careful diet. ______ 

## 2014-11-20 ENCOUNTER — Other Ambulatory Visit: Payer: Self-pay

## 2014-11-20 MED ORDER — METOPROLOL TARTRATE 25 MG PO TABS: ORAL_TABLET | ORAL | Status: DC

## 2014-11-20 MED ORDER — METOPROLOL TARTRATE 25 MG PO TABS: 12.5000 mg | ORAL_TABLET | Freq: Two times a day (BID) | ORAL | Status: DC

## 2014-11-20 MED ORDER — PANTOPRAZOLE SODIUM 40 MG PO TBEC: 40.0000 mg | DELAYED_RELEASE_TABLET | Freq: Every day | ORAL | Status: DC

## 2014-11-20 NOTE — Telephone Encounter (Signed)
error 

## 2014-11-29 ENCOUNTER — Telehealth: Payer: Self-pay

## 2014-11-29 NOTE — Telephone Encounter (Signed)
OK to refill Xanax 

## 2014-11-29 NOTE — Telephone Encounter (Signed)
Left message to call back Local or mail order for Xanax

## 2014-11-30 NOTE — Telephone Encounter (Signed)
[facility]

## 2014-12-03 NOTE — Telephone Encounter (Signed)
Left message to call back  

## 2014-12-03 NOTE — Telephone Encounter (Signed)
Follow Up ° ° ° ° ° °Pt returning Melinda's phone call. °

## 2014-12-04 ENCOUNTER — Encounter: Payer: Self-pay | Admitting: Cardiology

## 2014-12-04 ENCOUNTER — Other Ambulatory Visit: Payer: Self-pay | Admitting: *Deleted

## 2014-12-04 DIAGNOSIS — F419 Anxiety disorder, unspecified: Secondary | ICD-10-CM

## 2014-12-04 MED ORDER — ALPRAZOLAM 0.25 MG PO TABS: ORAL_TABLET | ORAL | Status: DC

## 2014-12-04 NOTE — Telephone Encounter (Signed)
This encounter was created in error - please disregard.

## 2014-12-04 NOTE — Telephone Encounter (Signed)
Left message to call back     Robert Dyer at 12/04/2014 10:36 AM     Status: Signed       Expand All Collapse All   New Message   Patient is returning nurses call , please give patient a call back.   Thanks.

## 2014-12-04 NOTE — Telephone Encounter (Signed)
New Message   Patient is returning nurses call , please give patient a call back.   Thanks.

## 2014-12-05 NOTE — Telephone Encounter (Signed)
Follow Up ° ° ° ° ° °Pt returning Melinda's phone call. °

## 2014-12-14 NOTE — Telephone Encounter (Signed)
Will have  Dr. Patty SermonsBrackbill sign and fax rx for Xanax today, he has been out of the office

## 2015-01-26 ENCOUNTER — Other Ambulatory Visit: Payer: Self-pay | Admitting: Cardiology

## 2015-01-28 ENCOUNTER — Other Ambulatory Visit: Payer: Self-pay | Admitting: Cardiology

## 2015-03-05 ENCOUNTER — Telehealth: Payer: Self-pay | Admitting: Cardiology

## 2015-03-05 MED ORDER — ROSUVASTATIN CALCIUM 10 MG PO TABS: 10.0000 mg | ORAL_TABLET | Freq: Every day | ORAL | Status: DC

## 2015-03-05 MED ORDER — CLOPIDOGREL BISULFATE 75 MG PO TABS: 75.0000 mg | ORAL_TABLET | Freq: Every day | ORAL | Status: DC

## 2015-03-05 MED ORDER — PANTOPRAZOLE SODIUM 40 MG PO TBEC: 40.0000 mg | DELAYED_RELEASE_TABLET | Freq: Every day | ORAL | Status: DC

## 2015-03-05 NOTE — Telephone Encounter (Signed)
Refilled Crestor and Plavix to mail order as requested

## 2015-03-05 NOTE — Telephone Encounter (Signed)
New problem   Pt need to speak back to you concerning him needing his prescription sent to a new company that he stated you was going to send for him. Pt stated they haven't received them as of today. Please call pt.

## 2015-05-14 ENCOUNTER — Encounter: Payer: Self-pay | Admitting: *Deleted

## 2015-05-15 ENCOUNTER — Ambulatory Visit (INDEPENDENT_AMBULATORY_CARE_PROVIDER_SITE_OTHER): Payer: PRIVATE HEALTH INSURANCE | Admitting: Cardiology

## 2015-05-15 ENCOUNTER — Encounter: Payer: Self-pay | Admitting: Cardiology

## 2015-05-15 DIAGNOSIS — E7800 Pure hypercholesterolemia, unspecified: Secondary | ICD-10-CM

## 2015-05-15 LAB — LIPID PANEL
CHOLESTEROL: 131 mg/dL (ref 0–200)
HDL: 29.8 mg/dL — AB (ref >39.00)
LDL CALC: 81 mg/dL (ref 0–99)
NonHDL: 101.36
Total CHOL/HDL Ratio: 4
Triglycerides: 100 mg/dL (ref 0.0–149.0)
VLDL: 20 mg/dL (ref 0.0–40.0)

## 2015-05-15 LAB — BASIC METABOLIC PANEL
BUN: 14 mg/dL (ref 6–23)
CALCIUM: 9.2 mg/dL (ref 8.4–10.5)
CO2: 24 mEq/L (ref 19–32)
Chloride: 107 mEq/L (ref 96–112)
Creatinine, Ser: 0.94 mg/dL (ref 0.40–1.50)
GFR: 88.58 mL/min (ref >60.00)
Glucose, Bld: 93 mg/dL (ref 70–99)
Potassium: 4.2 mEq/L (ref 3.5–5.1)
SODIUM: 140 meq/L (ref 135–145)

## 2015-05-15 LAB — HEPATIC FUNCTION PANEL
ALBUMIN: 4.2 g/dL (ref 3.5–5.2)
ALT: 26 U/L (ref 0–53)
AST: 16 U/L (ref 0–37)
Alkaline Phosphatase: 42 U/L (ref 39–117)
Bilirubin, Direct: 0 mg/dL (ref 0.0–0.3)
Total Bilirubin: 0.3 mg/dL (ref 0.2–1.2)
Total Protein: 6.6 g/dL (ref 6.0–8.3)

## 2015-05-15 NOTE — Progress Notes (Signed)
Cardiology Office Note   Date:  05/15/2015   ID:  Robert Dyer, DOB [birth year], MRN [id number]  PCP:  No PCP Per Patient  Cardiologist: Cassell Clement MD  No chief complaint on file.     History of Present Illness: Robert Dyer is a 54 y.o. male who presents for a six-month follow-up visit  This pleasant 54 year old gentleman is seen for a scheduled six-month followup office visit. He has a history of ischemic heart disease. He had a non-STEMI in May 2012 treated with a cutting balloon PCI to the ramus. He has a history of LV dysfunction with an ejection fraction of 45%. He quit smoking at the time of his heart attack but then started back and is now smoking about 5 cigarettes a day against advice. He has been using Chantix intermittently. The patient has not been experiencing any angina pectoris or symptoms of CHF. After his last office visit he had an echocardiogram on 08/02/13 which showed slight improvement in left ventricular function with his ejection fraction now 45-50%. There was no aortic insufficiency or mitral regurgitation. Since last visit he denies any chest pain. He has not had to take any nitroglycerin.  Since last visit he has changed jobs.  He is now working during the daytime.  His work is less physically active.  He has been eating more.  His weight is up 10 pounds.   Past Medical History  Diagnosis Date  . MI, acute, non ST segment elevation     s/p cutting balloon PTCA to the ramus intermedius  . GERD (gastroesophageal reflux disease)   . Hyperlipidemia   . Hypertension   . Coronary artery disease   . Tobacco abuse, in remission     Past Surgical History  Procedure Laterality Date  . Hemorroidectomy    . Cardiac catheterization  02/03/2011    revealing a total occlusion of the mid first diagonal with a 99% ostial stenosis of the ramus intermedius, and otherwise nonobstructive  disease.  EF was 45% with distal anterolateral akinesis.  The first   diagonal was felt to be the infarct vessel that was felt that it was  likely occluded for some time and was not likely to benefit with  reperfusion.    . Coronary angioplasty with stent placement  02/03/2011    s/p cutting balloon PTCA to the ramus intermediate     Current Outpatient Prescriptions  Medication Sig Dispense Refill  . ALPRAZolam (XANAX) 0.25 MG tablet Take 0.25 mg by mouth 3 (three) times daily as needed for anxiety (anxiety).    Marland Kitchen aspirin 81 MG tablet Take 81 mg by mouth daily.      . clopidogrel (PLAVIX) 75 MG tablet Take 1 tablet (75 mg total) by mouth daily. 90 tablet 3  . metoprolol tartrate (LOPRESSOR) 25 MG tablet Take 0.5 tablets (12.5 mg total) by mouth 2 (two) times daily. TAKE 1/2 TABLET BY MOUTH TWICE DAILY 90 tablet 2  . pantoprazole (PROTONIX) 40 MG tablet Take 1 tablet (40 mg total) by mouth daily. 15 tablet 2  . rosuvastatin (CRESTOR) 10 MG tablet Take 1 tablet (10 mg total) by mouth daily. 90 tablet 3  . varenicline (CHANTIX) 1 MG tablet TAKE 1 TABLET (1 MG TOTAL) BY MOUTH 2 (TWO) TIMES DAILY. 180 tablet 1  . nitroGLYCERIN (NITROSTAT) 0.4 MG SL tablet Place 1 tablet (0.4 mg total) under the tongue every 5 (five) minutes as needed for chest pain. 25 tablet 3  No current facility-administered medications for this visit.    Allergies:   Statins    Social History:  The patient  reports that he has been smoking Cigarettes.  He does not have any smokeless tobacco history on file. He reports that he drinks alcohol. He reports that he uses illicit drugs (Marijuana).   Family History:  The patient's family history includes Heart attack in his mother; Kidney disease in his father; Liver disease in his father.    ROS:  Please see the history of present illness.   Otherwise, review of systems are positive for none.   All other systems are reviewed and negative.    PHYSICAL EXAM: VS:  BP 112/78 mmHg  Pulse 65  Ht 5\' 10"  (1.778 m)  Wt 212 lb 12.8 oz (96.525 kg)   BMI 30.53 kg/m2 , BMI Body mass index is 30.53 kg/(m^2). GEN: Well nourished, well developed, in no acute distress HEENT: normal Neck: no JVD, carotid bruits, or masses Cardiac: RRR; no murmurs, rubs, or gallops,no edema  Respiratory:  clear to auscultation bilaterally, normal work of breathing GI: soft, nontender, nondistended, + BS MS: no deformity or atrophy Skin: warm and dry, no rash Neuro:  Strength and sensation are intact Psych: euthymic mood, full affect   EKG:  EKG is not ordered today.    Recent Labs: 05/15/2015: ALT 26; BUN 14; Creatinine, Ser 0.94; Potassium 4.2; Sodium 140    Lipid Panel    Component Value Date/Time   CHOL 131 05/15/2015 1122   TRIG 100.0 05/15/2015 1122   HDL 29.80* 05/15/2015 1122   CHOLHDL 4 05/15/2015 1122   VLDL 20.0 05/15/2015 1122   LDLCALC 81 05/15/2015 1122      Wt Readings from Last 3 Encounters:  05/15/15 212 lb 12.8 oz (96.525 kg)  10/15/14 202 lb (91.627 kg)  02/20/14 202 lb (91.627 kg)        ASSESSMENT AND PLAN:  1. Ischemic heart disease status post non-STEMI in May of 2012 treated with Cutting Balloon PCI to the ramus. 2. chronic left ventricular systolic dysfunction with ejection fraction 45-50% 3. hypertensive heart disease without heart failure 4. tobacco abuse 5. Hyperlipidemia 6. GERD  Disposition: We are checking lab work today.  Continue current medication.  Recheck in 6 months for office visit and fasting lab work.  Lose weight.   Current medicines are reviewed at length with the patient today.  The patient does not have concerns regarding medicines.  The following changes have been made:  no change  Labs/ tests ordered today include:  No orders of the defined types were placed in this encounter.      Karie Schwalbe MD 05/15/2015 6:32 PM    [facility] [address] [place], [place], [place]  [address] Phone: [phone]; Fax: [phone]

## 2015-05-15 NOTE — Patient Instructions (Signed)
Medication Instructions:  Your physician recommends that you continue on your current medications as directed. Please refer to the Current Medication list given to you today.  Labwork: Lp/bmet/hfp  Testing/Procedures: none  Follow-Up: Your physician wants you to follow-up in: 6 months with fasting labs (lp/bmet/hfp)  You will receive a reminder letter in the mail two months in advance. If you don't receive a letter, please call our office to schedule the follow-up appointment.    

## 2015-05-15 NOTE — Progress Notes (Signed)
Quick Note:  Please report to patient. The recent labs are stable. Continue same medication and careful diet. ______ 

## 2015-09-04 ENCOUNTER — Other Ambulatory Visit: Payer: Self-pay

## 2015-09-04 DIAGNOSIS — I519 Heart disease, unspecified: Secondary | ICD-10-CM

## 2015-09-04 DIAGNOSIS — F419 Anxiety disorder, unspecified: Secondary | ICD-10-CM

## 2015-09-04 MED ORDER — METOPROLOL TARTRATE 25 MG PO TABS: 12.5000 mg | ORAL_TABLET | Freq: Two times a day (BID) | ORAL | Status: DC

## 2015-09-04 MED ORDER — ROSUVASTATIN CALCIUM 10 MG PO TABS: 10.0000 mg | ORAL_TABLET | Freq: Every day | ORAL | Status: DC

## 2015-09-04 MED ORDER — CLOPIDOGREL BISULFATE 75 MG PO TABS: 75.0000 mg | ORAL_TABLET | Freq: Every day | ORAL | Status: DC

## 2015-09-04 NOTE — Telephone Encounter (Signed)
Cassell Clementhomas Brackbill, MD at 05/15/2015 11:05 AM  clopidogrel (PLAVIX) 75 MG tabletTake 1 tablet (75 mg total) by mouth daily metoprolol tartrate (LOPRESSOR) 25 MG tabletTake 0.5 tablets (12.5 mg total) by mouth 2 (two) times daily rosuvastatin (CRESTOR) 10 MG tabletTake 1 tablet (10 mg total) by mouth daily Patient Instructions     Medication Instructions:  Your physician recommends that you continue on your current medications as directed. Please refer to the Current Medication list given to you today.   Notes Recorded by Cassell Clementhomas Brackbill, MD on 05/15/2015 at 9:06 PM Please report to patient. The recent labs are stable. Continue same medication and careful diet

## 2015-09-05 ENCOUNTER — Other Ambulatory Visit: Payer: Self-pay

## 2015-09-05 MED ORDER — PANTOPRAZOLE SODIUM 40 MG PO TBEC: 40.0000 mg | DELAYED_RELEASE_TABLET | Freq: Every day | ORAL | Status: DC

## 2015-09-05 NOTE — Telephone Encounter (Signed)
Robert Clementhomas Brackbill, MD at 05/15/2015 11:05 AM  pantoprazole (PROTONIX) 40 MG tabletTake 1 tablet (40 mg total) by mouth daily Current medicines are reviewed at length with the patient today. The patient does not have concerns regarding medicines.  The following changes have been made: no change

## 2015-09-11 MED ORDER — ALPRAZOLAM 0.25 MG PO TABS: 0.2500 mg | ORAL_TABLET | Freq: Three times a day (TID) | ORAL | Status: DC | PRN

## 2015-09-11 MED ORDER — NITROGLYCERIN 0.4 MG SL SUBL: 0.4000 mg | SUBLINGUAL_TABLET | SUBLINGUAL | Status: DC | PRN

## 2015-10-28 ENCOUNTER — Other Ambulatory Visit: Payer: BLUE CROSS/BLUE SHIELD

## 2015-10-28 ENCOUNTER — Ambulatory Visit (INDEPENDENT_AMBULATORY_CARE_PROVIDER_SITE_OTHER): Payer: BLUE CROSS/BLUE SHIELD | Admitting: Cardiology

## 2015-10-28 ENCOUNTER — Encounter: Payer: Self-pay | Admitting: Cardiology

## 2015-10-28 VITALS — BP 108/80 | HR 64 | Ht 68.0 in | Wt 217.1 lb

## 2015-10-28 DIAGNOSIS — I259 Chronic ischemic heart disease, unspecified: Secondary | ICD-10-CM

## 2015-10-28 DIAGNOSIS — E7800 Pure hypercholesterolemia, unspecified: Secondary | ICD-10-CM

## 2015-10-28 DIAGNOSIS — I519 Heart disease, unspecified: Secondary | ICD-10-CM | POA: Diagnosis not present

## 2015-10-28 DIAGNOSIS — Z720 Tobacco use: Secondary | ICD-10-CM | POA: Diagnosis not present

## 2015-10-28 LAB — HEPATIC FUNCTION PANEL
ALBUMIN: 4.3 g/dL (ref 3.6–5.1)
ALK PHOS: 40 U/L (ref 40–115)
ALT: 35 U/L (ref 9–46)
AST: 22 U/L (ref 10–35)
BILIRUBIN TOTAL: 0.3 mg/dL (ref 0.2–1.2)
Bilirubin, Direct: 0.1 mg/dL (ref <=0.2)
Indirect Bilirubin: 0.2 mg/dL (ref 0.2–1.2)
Total Protein: 6.7 g/dL (ref 6.1–8.1)

## 2015-10-28 LAB — LIPID PANEL
CHOL/HDL RATIO: 5.1 ratio — AB (ref <=5.0)
CHOLESTEROL: 138 mg/dL (ref 125–200)
HDL: 27 mg/dL — AB (ref >=40)
LDL Cholesterol: 87 mg/dL (ref <130)
Triglycerides: 118 mg/dL (ref <150)
VLDL: 24 mg/dL (ref <30)

## 2015-10-28 LAB — BASIC METABOLIC PANEL
BUN: 14 mg/dL (ref 7–25)
CALCIUM: 9.3 mg/dL (ref 8.6–10.3)
CO2: 23 mmol/L (ref 20–31)
CREATININE: 1.06 mg/dL (ref 0.70–1.33)
Chloride: 104 mmol/L (ref 98–110)
GLUCOSE: 106 mg/dL — AB (ref 65–99)
Potassium: 4.4 mmol/L (ref 3.5–5.3)
SODIUM: 138 mmol/L (ref 135–146)

## 2015-10-28 MED ORDER — VARENICLINE TARTRATE 1 MG PO TABS: ORAL_TABLET | ORAL | Status: DC

## 2015-10-28 MED ORDER — NITROGLYCERIN 0.4 MG SL SUBL: 0.4000 mg | SUBLINGUAL_TABLET | SUBLINGUAL | Status: DC | PRN

## 2015-10-28 MED ORDER — VARENICLINE TARTRATE 0.5 MG X 11 & 1 MG X 42 PO MISC: ORAL | Status: DC

## 2015-10-28 NOTE — Patient Instructions (Signed)
Medication Instructions: Your physician recommends that you continue on your current medications as directed. Please refer to the Current Medication list given to you today.  Labwork: none  Testing/Procedures: none  Follow-Up: Your physician wants you to follow-up in: 6 month ov with Dr Croitoru  You will receive a reminder letter in the mail two months in advance. If you don't receive a letter, please call our office to schedule the follow-up appointment.  If you need a refill on your cardiac medications before your next appointment, please call your pharmacy.  

## 2015-10-28 NOTE — Progress Notes (Signed)
Quick Note:  Please report to patient. The recent labs are stable. Continue same medication and careful diet. Blood sugar is higher. Watch diet, lose weight. ______

## 2015-10-28 NOTE — Progress Notes (Signed)
Cardiology Office Note   Date:  10/28/2015   ID:  Robert Dyer, DOB [birth year], MRN [id number]  PCP:  No PCP Per Patient  Cardiologist: Cassell Clement MD  No chief complaint on file.     History of Present Illness: Robert Dyer is a 55 y.o. male who presents for scheduled follow-up office visit  This pleasant 55 year old gentleman is seen for a scheduled six-month followup office visit. He has a history of ischemic heart disease. He had a non-STEMI in May 2012 treated with a cutting balloon PCI to the ramus. He has a history of LV dysfunction with an ejection fraction of 45%. He quit smoking at the time of his heart attack but then started back and is now smoking about 5 cigarettes a day against advice and more recently he has increased back to a pack a day.   We are giving him a new prescription for Chantix at his request.  This time he intends to quit for good.  The patient has not been experiencing any angina pectoris or symptoms of CHF. He had an echocardiogram on 08/02/13 which showed slight improvement in left ventricular function with his ejection fraction now 45-50%. There was no aortic insufficiency or mitral regurgitation. Since last visit he denies any chest pain. He has not had to take any nitroglycerin. 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  He sleeps during the daytime. His appetite is good.  He has gained 5 pounds since last visit.  He is going to try to lose weight.  Past Medical History  Diagnosis Date  . MI, acute, non ST segment elevation (HCC)     s/p cutting balloon PTCA to the ramus intermedius  . GERD (gastroesophageal reflux disease)   . Hyperlipidemia   . Hypertension   . Coronary artery disease   . Tobacco abuse, in remission     Past Surgical History  Procedure Laterality Date  . Hemorroidectomy    . Cardiac catheterization  02/03/2011    revealing a total occlusion of the mid first diagonal with a 99%  ostial stenosis of the ramus intermedius, and otherwise nonobstructive  disease.  EF was 45% with distal anterolateral akinesis.  The first  diagonal was felt to be the infarct vessel that was felt that it was  likely occluded for some time and was not likely to benefit with  reperfusion.    . Coronary angioplasty with stent placement  02/03/2011    s/p cutting balloon PTCA to the ramus intermediate     Current Outpatient Prescriptions  Medication Sig Dispense Refill  . ALPRAZolam (XANAX) 0.25 MG tablet Take 1 tablet (0.25 mg total) by mouth 3 (three) times daily as needed for anxiety (anxiety). 90 tablet 2  . aspirin 81 MG tablet Take 81 mg by mouth daily.      . clopidogrel (PLAVIX) 75 MG tablet Take 1 tablet (75 mg total) by mouth daily. 90 tablet 0  . metoprolol tartrate (LOPRESSOR) 25 MG tablet Take 0.5 tablets (12.5 mg total) by mouth 2 (two) times daily. TAKE 1/2 TABLET BY MOUTH TWICE DAILY 90 tablet 0  . nitroGLYCERIN (NITROSTAT) 0.4 MG SL tablet Place 1 tablet (0.4 mg total) under the tongue every 5 (five) minutes as needed for chest pain. 25 tablet 5  . pantoprazole (PROTONIX) 40 MG tablet Take 1 tablet (40 mg total) by mouth daily. 90 tablet 1  . rosuvastatin (CRESTOR) 10 MG tablet Take  1 tablet (10 mg total) by mouth daily. 90 tablet 0  . varenicline (CHANTIX) 1 MG tablet TAKE 1 TABLET (1 MG TOTAL) BY MOUTH 2 (TWO) TIMES DAILY. 60 tablet 2  . varenicline (CHANTIX STARTING MONTH PAK) 0.5 MG X 11 & 1 MG X 42 tablet Take one 0.5 mg tablet by mouth once daily for 3 days, then increase to one 0.5 mg tablet twice daily for 4 days, then increase to one 1 mg tablet twice daily. 53 tablet 0   No current facility-administered medications for this visit.    Allergies:   Statins    Social History:  The patient  reports that he has been smoking Cigarettes.  He does not have any smokeless tobacco history on file. He reports that he drinks alcohol. He reports that he uses illicit drugs  (Marijuana).   Family History:  The patient's family history includes Heart attack in his mother; Kidney disease in his father; Liver disease in his father.    ROS:  Please see the history of present illness.   Otherwise, review of systems are positive for none.   All other systems are reviewed and negative.    PHYSICAL EXAM: VS:  BP 108/80 mmHg  Pulse 64  Ht 5\' 8"  (1.727 m)  Wt 217 lb 1.9 oz (98.485 kg)  BMI 33.02 kg/m2 , BMI Body mass index is 33.02 kg/(m^2). GEN: Well nourished, well developed, in no acute distress HEENT: normal Neck: no JVD, carotid bruits, or masses Cardiac: Regular sinus rhythm.; no murmurs, rubs, or gallops,no edema  Respiratory:  clear to auscultation bilaterally, normal work of breathing GI: soft, nontender, nondistended, + BS MS: no deformity or atrophy Skin: warm and dry, no rash Neuro:  Strength and sensation are intact Psych: euthymic mood, full affect   EKG:  EKG is ordered today. The ekg ordered today demonstrates normal sinus rhythm at 65 bpm.  No ischemic changes.   Recent Labs: 05/15/2015: ALT 26; BUN 14; Creatinine, Ser 0.94; Potassium 4.2; Sodium 140    Lipid Panel    Component Value Date/Time   CHOL 131 05/15/2015 1122   TRIG 100.0 05/15/2015 1122   HDL 29.80* 05/15/2015 1122   CHOLHDL 4 05/15/2015 1122   VLDL 20.0 05/15/2015 1122   LDLCALC 81 05/15/2015 1122      Wt Readings from Last 3 Encounters:  10/28/15 217 lb 1.9 oz (98.485 kg)  05/15/15 212 lb 12.8 oz (96.525 kg)  10/15/14 202 lb (91.627 kg)         ASSESSMENT AND PLAN:  1. Ischemic heart disease status post non-STEMI in May of 2012 treated with Cutting Balloon PCI to the ramus. 2. chronic left ventricular systolic dysfunction with ejection fraction 45-50% 3. hypertensive heart disease without heart failure.  Blood pressure is normal.  Continue current medication 4. tobacco abuse.  He is going to try Chantix again 5. Hyperlipidemia 6. GERD controlled on  Protonix   Current medicines are reviewed at length with the patient today.  The patient does not have concerns regarding medicines.  The following changes have been made:  no change  Labs/ tests ordered today include:   Orders Placed This Encounter  Procedures  . Lipid panel  . Hepatic function panel  . Basic metabolic panel  . EKG 12-Lead     Disposition:  Continue current medication.  Restart Chantix.  Work harder on weight loss and careful diet.  Recheck in 6 months for office visit with Dr. Royann Shivers  Signed, Maisie Fus  Odessie Polzin MD 10/28/2015 11:32 AM    [facility] [address] [place], [place], [place]  [address] Phone: [phone]; Fax: [phone]

## 2015-11-25 ENCOUNTER — Other Ambulatory Visit: Payer: Self-pay | Admitting: Cardiology

## 2016-01-27 ENCOUNTER — Other Ambulatory Visit: Payer: Self-pay | Admitting: *Deleted

## 2016-01-27 MED ORDER — METOPROLOL TARTRATE 25 MG PO TABS: 12.5000 mg | ORAL_TABLET | Freq: Two times a day (BID) | ORAL | Status: DC

## 2016-02-25 ENCOUNTER — Other Ambulatory Visit: Payer: Self-pay | Admitting: *Deleted

## 2016-02-25 MED ORDER — PANTOPRAZOLE SODIUM 40 MG PO TBEC: 40.0000 mg | DELAYED_RELEASE_TABLET | Freq: Every day | ORAL | Status: DC

## 2016-05-08 ENCOUNTER — Encounter: Payer: Self-pay | Admitting: Cardiovascular Disease

## 2016-05-08 ENCOUNTER — Ambulatory Visit (INDEPENDENT_AMBULATORY_CARE_PROVIDER_SITE_OTHER): Payer: BLUE CROSS/BLUE SHIELD | Admitting: Cardiovascular Disease

## 2016-05-08 VITALS — BP 120/82 | HR 62 | Ht 68.0 in | Wt 224.0 lb

## 2016-05-08 DIAGNOSIS — I519 Heart disease, unspecified: Secondary | ICD-10-CM

## 2016-05-08 DIAGNOSIS — I2510 Atherosclerotic heart disease of native coronary artery without angina pectoris: Secondary | ICD-10-CM

## 2016-05-08 DIAGNOSIS — Z720 Tobacco use: Secondary | ICD-10-CM

## 2016-05-08 DIAGNOSIS — I10 Essential (primary) hypertension: Secondary | ICD-10-CM

## 2016-05-08 DIAGNOSIS — E785 Hyperlipidemia, unspecified: Secondary | ICD-10-CM | POA: Diagnosis not present

## 2016-05-08 DIAGNOSIS — F419 Anxiety disorder, unspecified: Secondary | ICD-10-CM | POA: Diagnosis not present

## 2016-05-08 DIAGNOSIS — E669 Obesity, unspecified: Secondary | ICD-10-CM

## 2016-05-08 DIAGNOSIS — Z79899 Other long term (current) drug therapy: Secondary | ICD-10-CM | POA: Diagnosis not present

## 2016-05-08 LAB — LIPID PANEL
CHOLESTEROL: 120 mg/dL — AB (ref 125–200)
HDL: 28 mg/dL — ABNORMAL LOW (ref >=40)
LDL Cholesterol: 64 mg/dL (ref <130)
Total CHOL/HDL Ratio: 4.3 Ratio (ref <=5.0)
Triglycerides: 141 mg/dL (ref <150)
VLDL: 28 mg/dL (ref <30)

## 2016-05-08 LAB — COMPREHENSIVE METABOLIC PANEL
ALK PHOS: 38 U/L — AB (ref 40–115)
ALT: 29 U/L (ref 9–46)
AST: 19 U/L (ref 10–35)
Albumin: 4.2 g/dL (ref 3.6–5.1)
BILIRUBIN TOTAL: 0.4 mg/dL (ref 0.2–1.2)
BUN: 7 mg/dL (ref 7–25)
CALCIUM: 9 mg/dL (ref 8.6–10.3)
CO2: 26 mmol/L (ref 20–31)
Chloride: 107 mmol/L (ref 98–110)
Creat: 1.05 mg/dL (ref 0.70–1.33)
GLUCOSE: 86 mg/dL (ref 65–99)
Potassium: 4.3 mmol/L (ref 3.5–5.3)
Sodium: 140 mmol/L (ref 135–146)
TOTAL PROTEIN: 6.2 g/dL (ref 6.1–8.1)

## 2016-05-08 MED ORDER — ALPRAZOLAM 0.25 MG PO TABS: 0.2500 mg | ORAL_TABLET | Freq: Two times a day (BID) | ORAL | 3 refills | Status: DC | PRN

## 2016-05-08 NOTE — Progress Notes (Signed)
Cardiology Office Note    Date:  05/08/2016   ID:  Robert Dyer, DOB 1961/08/22, MRN [id number]  PCP:  No PCP Per Patient  Cardiologist:  Thurmon Fair, MD   Chief Complaint  Patient presents with  . Follow-up    History of Present Illness:  Robert Dyer is a 55 y.o. male with a history of coronary artery disease and non-STEMI in May 2012 treated with cutting balloon angioplasty of the ramus intermedius artery, hyperlipidemia, hypertension, obesity, ongoing tobacco abuse, anxiety disorder and GERD, returning for evaluation and to establish new cardiology care (after Dr. Yevonne Pax retirement).  He is still trying to quit smoking and has curtailed 2 5-10 cigarettes a day. Unfortunately both his wife and his daughter are smokers in the household.  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 He denies shortness of breath, wheezing, cough, palpitations, syncope, edema, intermittent claudication or erectile dysfunction. He does have anxiety and has been taking alprazolam ever since his heart attack. After his MI he lost quite a bit of weight down 280 pounds and was walking on a daily basis. He then developed arthritis in his right foot and is no longer exercising. He is now moderately obese with a BMI of 34.    Past Medical History:  Diagnosis Date  . Coronary artery disease   . GERD (gastroesophageal reflux disease)   . Hyperlipidemia   . Hypertension   . MI, acute, non ST segment elevation (HCC)    s/p cutting balloon PTCA to the ramus intermedius  . Tobacco abuse, in remission     Past Surgical History:  Procedure Laterality Date  . CARDIAC CATHETERIZATION  02/03/2011   revealing a total occlusion of the mid first diagonal with a 99% ostial stenosis of the ramus intermedius, and otherwise nonobstructive  disease.  EF was 45% with distal anterolateral akinesis.  The first  diagonal was felt to be the infarct  vessel that was felt that it was  likely occluded for some time and was not likely to benefit with  reperfusion.    . CORONARY ANGIOPLASTY WITH STENT PLACEMENT  02/03/2011   s/p cutting balloon PTCA to the ramus intermediate  . HEMORROIDECTOMY      Current Medications: Outpatient Medications Prior to Visit  Medication Sig Dispense Refill  . aspirin 81 MG tablet Take 81 mg by mouth daily.      . clopidogrel (PLAVIX) 75 MG tablet TAKE 1 TABLET (75 MG TOTAL) BY MOUTH DAILY. 90 tablet 3  . metoprolol tartrate (LOPRESSOR) 25 MG tablet Take 0.5 tablets (12.5 mg total) by mouth 2 (two) times daily. 90 tablet 2  . nitroGLYCERIN (NITROSTAT) 0.4 MG SL tablet Place 1 tablet (0.4 mg total) under the tongue every 5 (five) minutes as needed for chest pain. 25 tablet 5  . pantoprazole (PROTONIX) 40 MG tablet Take 1 tablet (40 mg total) by mouth daily. 90 tablet 1  . rosuvastatin (CRESTOR) 10 MG tablet TAKE 1 TABLET (10 MG TOTAL) BY MOUTH DAILY. 90 tablet 3  . varenicline (CHANTIX STARTING MONTH PAK) 0.5 MG X 11 & 1 MG X 42 tablet Take one 0.5 mg tablet by mouth once daily for 3 days, then increase to one 0.5 mg tablet twice daily for 4 days, then increase to one 1 mg tablet twice daily. 53 tablet 0  . varenicline (CHANTIX) 1 MG tablet TAKE 1 TABLET (1 MG TOTAL) BY MOUTH 2 (  TWO) TIMES DAILY. 60 tablet 2  . ALPRAZolam (XANAX) 0.25 MG tablet Take 1 tablet (0.25 mg total) by mouth 3 (three) times daily as needed for anxiety (anxiety). 90 tablet 2   No facility-administered medications prior to visit.      Allergies:   Statins   Social History   Social History  . Marital status: Married    Spouse name: N/A  . Number of children: N/A  . Years of education: N/A   Social History Main Topics  . Smoking status: Current Some Day Smoker    Types: Cigarettes  . Smokeless tobacco: None  . Alcohol use Yes  . Drug use:     Types: Marijuana  . Sexual activity: Not Asked   Other Topics Concern  . None    Social History Narrative  . None     Family History:  The patient's family history includes Heart attack in his mother; Kidney disease in his father; Liver disease in his father.   ROS:   Please see the history of present illness.    ROS All other systems reviewed and are negative.   PHYSICAL EXAM:   VS:  BP 120/82 (BP Location: Right Arm, Patient Position: Sitting, Cuff Size: Normal)   Pulse 62   Ht 5\' 8"  (1.727 m)   Wt 224 lb (101.6 kg)   SpO2 96%   BMI 34.06 kg/m    GEN: Well nourished, well developed, in no acute distress  HEENT: normal  Neck: no JVD, carotid bruits, or masses Cardiac: RRR; no murmurs, rubs, or gallops,no edema  Respiratory:  clear to auscultation bilaterally, normal work of breathing GI: soft, nontender, nondistended, + BS MS: no deformity or atrophy  Skin: warm and dry, no rash Neuro:  Alert and Oriented x 3, Strength and sensation are intact Psych: euthymic mood, full affect  Wt Readings from Last 3 Encounters:  05/08/16 224 lb (101.6 kg)  10/28/15 217 lb 1.9 oz (98.5 kg)  05/15/15 212 lb 12.8 oz (96.5 kg)      Studies/Labs Reviewed:   EKG:  EKG is ordered today.  The ekg ordered today demonstrates Normal sinus rhythm, tiny Q waves in leads V4-V6, 1 and aVL and no ischemic appearing repolarization abnormalities  Recent Labs: 10/28/2015: ALT 35; BUN 14; Creat 1.06; Potassium 4.4; Sodium 138   Lipid Panel    Component Value Date/Time   CHOL 138 10/28/2015 0942   TRIG 118 10/28/2015 0942   HDL 27 (L) 10/28/2015 0942   CHOLHDL 5.1 (H) 10/28/2015 0942   VLDL 24 10/28/2015 0942   LDLCALC 87 10/28/2015 0942     ASSESSMENT:    1. Coronary artery disease involving native coronary artery of native heart without angina pectoris   2. LV dysfunction   3. Essential hypertension   4. Dyslipidemia   5. Tobacco abuse   6. Anxiety   7. Obesity (BMI 30.0-34.9)   8. Medication management      PLAN:  In order of problems listed  above:  1. CAD: He denies angina pectoris despite a reasonably active lifestyle.  2. LV systolic dysfunction: Without clinical heart failure 3. HTN: Well-controlled 4. HLP: On highly active statin, LDL was close to target range. His HDL cholesterol is very low and unlikely to improve unless he restarts physical exercise and loses weight 5. Tobacco abuse: I think our first party is to achieve complete and permanent smoking cessation. This will be difficult unless he has collaboration of his family. Discussed methods  to reach this goal. I think it will be hard for him to focus on weight loss and stop his anxiolytic medications until he has successfully quit smoking 6. Anxiety: Ideally, he will gradually wean off anxiolytics, but will delay that efforts until he has stopped smoking 7. Obesity: Focus on a carbohydrate/calorie restricted diet and restart physical activity. If he can't walk he should try to swim or ride his bicycle.    Medication Adjustments/Labs and Tests Ordered: Current medicines are reviewed at length with the patient today.  Concerns regarding medicines are outlined above.  Medication changes, Labs and Tests ordered today are listed in the Patient Instructions below. Patient Instructions  Medication Instructions: Dr Royann Shiversroitoru recommends that you continue on your current medications as directed. Please refer to the Current Medication list given to you today.  Labwork: Your physician recommends that you return for lab work at your earliest convenience - FASTING.  Testing/Procedures: NONE ORDERED  Follow-up: Dr Royann Shiversroitoru recommends that you schedule a follow-up appointment in 6 months. You will receive a reminder letter in the mail two months in advance. If you don't receive a letter, please call our office to schedule the follow-up appointment.  If you need a refill on your cardiac medications before your next appointment, please call your pharmacy.   Your physician  discussed the hazards of tobacco use. Tobacco use cessation is recommended and techniques and options to help you quit were discussed.    Signed, Thurmon FairMihai Lavi Sheehan, MD  05/08/2016 9:59 AM    [facility] [address], [place], [place]  [address] Phone: [phone]; Fax: [phone]

## 2016-05-08 NOTE — Patient Instructions (Signed)
Medication Instructions: Dr Royann Shiversroitoru recommends that you continue on your current medications as directed. Please refer to the Current Medication list given to you today.  Labwork: Your physician recommends that you return for lab work at your earliest convenience - FASTING.  Testing/Procedures: NONE ORDERED  Follow-up: Dr Royann Shiversroitoru recommends that you schedule a follow-up appointment in 6 months. You will receive a reminder letter in the mail two months in advance. If you don't receive a letter, please call our office to schedule the follow-up appointment.  If you need a refill on your cardiac medications before your next appointment, please call your pharmacy.   Your physician discussed the hazards of tobacco use. Tobacco use cessation is recommended and techniques and options to help you quit were discussed.

## 2016-07-03 DIAGNOSIS — Z23 Encounter for immunization: Secondary | ICD-10-CM | POA: Diagnosis not present

## 2016-08-18 DIAGNOSIS — J329 Chronic sinusitis, unspecified: Secondary | ICD-10-CM | POA: Diagnosis not present

## 2016-08-21 ENCOUNTER — Other Ambulatory Visit: Payer: Self-pay | Admitting: Cardiovascular Disease

## 2016-08-25 ENCOUNTER — Telehealth: Payer: Self-pay | Admitting: Cardiovascular Disease

## 2016-08-25 DIAGNOSIS — Z720 Tobacco use: Secondary | ICD-10-CM

## 2016-08-25 NOTE — Telephone Encounter (Signed)
Pt says he would like for Dr C to write him a new prescription for Chantix please. If he approves it,would you call it in to [facility] on [address].

## 2016-08-25 NOTE — Telephone Encounter (Signed)
Message routed to Dr. Royann Shiversroitoru to advise on med refill. Med previously Rx'ed by Dr. Patty SermonsBrackbill.

## 2016-08-26 NOTE — Telephone Encounter (Signed)
OK to refill Chantix. Have his wife and daughter decided to quit also? MCr

## 2016-08-27 ENCOUNTER — Other Ambulatory Visit: Payer: Self-pay | Admitting: Cardiovascular Disease

## 2016-08-27 MED ORDER — VARENICLINE TARTRATE 0.5 MG X 11 & 1 MG X 42 PO MISC: ORAL | 0 refills | Status: DC

## 2016-08-27 MED ORDER — VARENICLINE TARTRATE 1 MG PO TABS: ORAL_TABLET | ORAL | 2 refills | Status: DC

## 2016-08-27 NOTE — Telephone Encounter (Signed)
Phoned in Rx.

## 2016-09-22 ENCOUNTER — Other Ambulatory Visit: Payer: Self-pay | Admitting: Cardiovascular Disease

## 2016-09-28 ENCOUNTER — Other Ambulatory Visit: Payer: Self-pay | Admitting: Cardiovascular Disease

## 2016-09-28 DIAGNOSIS — F419 Anxiety disorder, unspecified: Secondary | ICD-10-CM

## 2016-10-17 ENCOUNTER — Other Ambulatory Visit: Payer: Self-pay | Admitting: Cardiovascular Disease

## 2016-11-05 ENCOUNTER — Encounter: Payer: Self-pay | Admitting: Cardiovascular Disease

## 2016-11-05 ENCOUNTER — Ambulatory Visit (INDEPENDENT_AMBULATORY_CARE_PROVIDER_SITE_OTHER): Payer: BLUE CROSS/BLUE SHIELD | Admitting: Cardiovascular Disease

## 2016-11-05 VITALS — BP 120/88 | HR 56 | Ht 68.0 in | Wt 236.0 lb

## 2016-11-05 DIAGNOSIS — Z79899 Other long term (current) drug therapy: Secondary | ICD-10-CM | POA: Diagnosis not present

## 2016-11-05 DIAGNOSIS — E668 Other obesity: Secondary | ICD-10-CM | POA: Diagnosis not present

## 2016-11-05 DIAGNOSIS — I2510 Atherosclerotic heart disease of native coronary artery without angina pectoris: Secondary | ICD-10-CM | POA: Diagnosis not present

## 2016-11-05 DIAGNOSIS — F419 Anxiety disorder, unspecified: Secondary | ICD-10-CM | POA: Diagnosis not present

## 2016-11-05 DIAGNOSIS — I10 Essential (primary) hypertension: Secondary | ICD-10-CM | POA: Diagnosis not present

## 2016-11-05 DIAGNOSIS — I519 Heart disease, unspecified: Secondary | ICD-10-CM

## 2016-11-05 DIAGNOSIS — E785 Hyperlipidemia, unspecified: Secondary | ICD-10-CM | POA: Diagnosis not present

## 2016-11-05 LAB — COMPREHENSIVE METABOLIC PANEL
ALT: 32 U/L (ref 9–46)
AST: 20 U/L (ref 10–35)
Albumin: 4.2 g/dL (ref 3.6–5.1)
Alkaline Phosphatase: 39 U/L — ABNORMAL LOW (ref 40–115)
BUN: 10 mg/dL (ref 7–25)
CO2: 25 mmol/L (ref 20–31)
CREATININE: 0.99 mg/dL (ref 0.70–1.33)
Calcium: 9.1 mg/dL (ref 8.6–10.3)
Chloride: 109 mmol/L (ref 98–110)
Glucose, Bld: 98 mg/dL (ref 65–99)
Potassium: 4.4 mmol/L (ref 3.5–5.3)
SODIUM: 139 mmol/L (ref 135–146)
Total Bilirubin: 0.4 mg/dL (ref 0.2–1.2)
Total Protein: 6.2 g/dL (ref 6.1–8.1)

## 2016-11-05 LAB — LIPID PANEL
CHOLESTEROL: 120 mg/dL (ref <200)
HDL: 26 mg/dL — AB (ref >40)
LDL Cholesterol: 66 mg/dL (ref <100)
TRIGLYCERIDES: 142 mg/dL (ref <150)
Total CHOL/HDL Ratio: 4.6 Ratio (ref <5.0)
VLDL: 28 mg/dL (ref <30)

## 2016-11-05 MED ORDER — ALPRAZOLAM 0.25 MG PO TABS: 0.2500 mg | ORAL_TABLET | Freq: Three times a day (TID) | ORAL | 0 refills | Status: DC | PRN

## 2016-11-05 NOTE — Progress Notes (Signed)
Cardiology Office Note    Date:  11/05/2016   ID:  Robert Dyer, DOB 04-May-1961, MRN [id number]  PCP:  No PCP Per Patient  Cardiologist:  Thurmon Fair, MD   Chief Complaint  Patient presents with  . Follow-up    History of Present Illness:  Robert Dyer is a 56 y.o. male with a history of coronary artery disease and non-STEMI in May 2012 treated with cutting balloon angioplasty of the ramus intermedius artery, hyperlipidemia, hypertension, obesity, ongoing tobacco abuse, anxiety disorder and GERD, returning for evaluation and to establish new cardiology care (after Dr. Yevonne Pax retirement).  He has successfully quit smoking and so has his wife. His daughter, who continues smoking has moved out of the household. He has had a lot of anxiety and has a lot of smoking cravings. He still takes Chantix. He feels he still needs occasional anxiolytics and has run out of his alprazolam, requests a refill. He has gained more weight after he quit smoking. He does have anxiety and has been taking alprazolam ever since his heart attack. He has not started exercising again here at his BMI is almost 36.  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 He denies shortness of breath, wheezing, cough, palpitations, syncope, edema, intermittent claudication or erectile dysfunction.     Past Medical History:  Diagnosis Date  . Coronary artery disease   . GERD (gastroesophageal reflux disease)   . Hyperlipidemia   . Hypertension   . MI, acute, non ST segment elevation (HCC)    s/p cutting balloon PTCA to the ramus intermedius  . Tobacco abuse, in remission     Past Surgical History:  Procedure Laterality Date  . CARDIAC CATHETERIZATION  02/03/2011   revealing a total occlusion of the mid first diagonal with a 99% ostial stenosis of the ramus intermedius, and otherwise nonobstructive  disease.  EF was 45% with distal anterolateral  akinesis.  The first  diagonal was felt to be the infarct vessel that was felt that it was  likely occluded for some time and was not likely to benefit with  reperfusion.    . CORONARY ANGIOPLASTY WITH STENT PLACEMENT  02/03/2011   s/p cutting balloon PTCA to the ramus intermediate  . HEMORROIDECTOMY      Current Medications: Outpatient Medications Prior to Visit  Medication Sig Dispense Refill  . aspirin 81 MG tablet Take 81 mg by mouth daily.      . clopidogrel (PLAVIX) 75 MG tablet TAKE 1 TABLET (75 MG TOTAL) BY MOUTH DAILY. 90 tablet 3  . metoprolol tartrate (LOPRESSOR) 25 MG tablet TAKE 1/2 TABLET TWICE A DAY 90 tablet 0  . pantoprazole (PROTONIX) 40 MG tablet TAKE 1 TABLET (40 MG TOTAL) BY MOUTH DAILY. 90 tablet 1  . rosuvastatin (CRESTOR) 10 MG tablet TAKE 1 TABLET (10 MG TOTAL) BY MOUTH DAILY. 90 tablet 3  . varenicline (CHANTIX STARTING MONTH PAK) 0.5 MG X 11 & 1 MG X 42 tablet Take one 0.5 mg tablet by mouth once daily for 3 days, then increase to one 0.5 mg tablet twice daily for 4 days, then increase to one 1 mg tablet twice daily. 53 tablet 0  . varenicline (CHANTIX) 1 MG tablet TAKE 1 TABLET (1 MG TOTAL) BY MOUTH 2 (TWO) TIMES DAILY. 60 tablet 2  . nitroGLYCERIN (NITROSTAT) 0.4 MG SL tablet Place 1 tablet (0.4 mg total) under the tongue every 5 (five)  minutes as needed for chest pain. 25 tablet 5  . ALPRAZolam (XANAX) 0.25 MG tablet Take 1 tablet (0.25 mg total) by mouth 2 (two) times daily as needed for anxiety (anxiety). (Patient not taking: Reported on 11/05/2016) 60 tablet 3  . pantoprazole (PROTONIX) 40 MG tablet TAKE 1 TABLET (40 MG TOTAL) BY MOUTH DAILY. (Patient not taking: Reported on 11/05/2016) 90 tablet 1   No facility-administered medications prior to visit.      Allergies:   Statins   Social History   Social History  . Marital status: Married    Spouse name: N/A  . Number of children: N/A  . Years of education: N/A   Social History Main Topics  . Smoking  status: Former Smoker    Types: Cigarettes  . Smokeless tobacco: Not on file  . Alcohol use Yes  . Drug use: Yes    Types: Marijuana  . Sexual activity: Not on file   Other Topics Concern  . Not on file   Social History Narrative  . No narrative on file     Family History:  The patient's family history includes Heart attack in his mother; Kidney disease in his father; Liver disease in his father.   ROS:   Please see the history of present illness.    ROS All other systems reviewed and are negative.   PHYSICAL EXAM:   VS:  BP 120/88 (BP Location: Right Arm, Patient Position: Sitting, Cuff Size: Large)   Pulse (!) 56   Ht 5\' 8"  (1.727 m)   Wt 107 kg (236 lb)   BMI 35.88 kg/m    GEN: Well nourished, well developed, in no acute distress  HEENT: normal  Neck: no JVD, carotid bruits, or masses Cardiac: RRR; no murmurs, rubs, or gallops,no edema  Respiratory:  clear to auscultation bilaterally, normal work of breathing GI: soft, nontender, nondistended, + BS MS: no deformity or atrophy  Skin: warm and dry, no rash Neuro:  Alert and Oriented x 3, Strength and sensation are intact Psych: euthymic mood, full affect  Wt Readings from Last 3 Encounters:  11/05/16 107 kg (236 lb)  05/08/16 101.6 kg (224 lb)  10/28/15 98.5 kg (217 lb 1.9 oz)      Studies/Labs Reviewed:   EKG:  EKG is ordered today.  The ekg ordered today demonstrates Normal sinus rhythm, tiny Q waves in leads V4-V6, 1 and aVL and no ischemic appearing repolarization abnormalities  Recent Labs: 05/08/2016: ALT 29; BUN 7; Creat 1.05; Potassium 4.3; Sodium 140   Lipid Panel    Component Value Date/Time   CHOL 120 (L) 05/08/2016 1413   TRIG 141 05/08/2016 1413   HDL 28 (L) 05/08/2016 1413   CHOLHDL 4.3 05/08/2016 1413   VLDL 28 05/08/2016 1413   LDLCALC 64 05/08/2016 1413     ASSESSMENT:    1. Coronary artery disease involving native coronary artery of native heart without angina pectoris   2. LV  dysfunction   3. Essential hypertension   4. Dyslipidemia   5. Anxiety   6. Moderate obesity   7. Medication management      PLAN:  In order of problems listed above:  1. CAD: He denies angina pectoris despite an active lifestyle.  2. LV systolic dysfunction: Without clinical heart failure 3. HTN: Well-controlled 4. HLP: On highly active statin, LDL was close to target range. His HDL cholesterol is very low and unlikely to improve unless he restarts physical exercise and loses weight 5.  Tobacco abuse: Heartily congratulated him on smoking cessation. Acknowledged the fact that his anxiety and weight gain are worsened by this, but in the long run this will have a big positive impact. 6. Anxiety: Ideally, he will gradually wean off anxiolytics. He agrees and will try to gradually curtail his use of alprazolam over the next 3 months  7. Obesity: Focus on a carbohydrate/calorie restricted diet and restart physical activity. He brought a bicycle, but has not yet used it.    Medication Adjustments/Labs and Tests Ordered: Current medicines are reviewed at length with the patient today.  Concerns regarding medicines are outlined above.  Medication changes, Labs and Tests ordered today are listed in the Patient Instructions below. There are no Patient Instructions on file for this visit.   Signed, Thurmon Fair, MD  11/05/2016 8:54 AM    [facility] [address] [place], [place], [place]  [address] Phone: [phone]; Fax: [phone]

## 2016-11-05 NOTE — Patient Instructions (Signed)
Medication Instructions: Dr Royann Shiversroitoru has recommended making the following medication changes: 1. STOP Clopidogrel (Plavix) 2. OVER THE NEXT 3 MONTHS - WEAN OFF Alprazolam 0.25 mg - Dr C has written a one time prescription  Labwork: Your physician recommends that you return for lab work at your earliest convenience - FASTING.  Testing/Procedures: NONE ORDERED  Follow-up: Dr Royann Shiversroitoru recommends that you schedule a follow-up appointment in 1 year. You will receive a reminder letter in the mail two months in advance. If you don't receive a letter, please call our office to schedule the follow-up appointment.  If you need a refill on your cardiac medications before your next appointment, please call your pharmacy.

## 2016-11-06 ENCOUNTER — Encounter: Payer: Self-pay | Admitting: Cardiovascular Disease

## 2016-11-18 ENCOUNTER — Other Ambulatory Visit: Payer: Self-pay

## 2016-11-18 MED ORDER — ROSUVASTATIN CALCIUM 10 MG PO TABS: ORAL_TABLET | ORAL | 3 refills | Status: DC

## 2016-11-18 NOTE — Addendum Note (Signed)
Addended by: Neta EhlersRUITT, Charvez Voorhies M on: 11/18/2016 02:18 PM   Modules accepted: Orders

## 2016-11-20 ENCOUNTER — Other Ambulatory Visit: Payer: Self-pay

## 2017-01-13 ENCOUNTER — Other Ambulatory Visit: Payer: Self-pay | Admitting: Cardiovascular Disease

## 2017-01-13 NOTE — Telephone Encounter (Signed)
REFILL 

## 2017-02-20 ENCOUNTER — Other Ambulatory Visit: Payer: Self-pay | Admitting: Cardiovascular Disease

## 2017-11-08 ENCOUNTER — Telehealth: Payer: Self-pay

## 2017-11-08 ENCOUNTER — Ambulatory Visit: Payer: BLUE CROSS/BLUE SHIELD | Admitting: Physician Assistant

## 2017-11-08 ENCOUNTER — Encounter: Payer: Self-pay | Admitting: Physician Assistant

## 2017-11-08 VITALS — BP 152/98 | HR 55 | Ht 70.0 in | Wt 244.2 lb

## 2017-11-08 DIAGNOSIS — I214 Non-ST elevation (NSTEMI) myocardial infarction: Secondary | ICD-10-CM | POA: Diagnosis not present

## 2017-11-08 DIAGNOSIS — E7800 Pure hypercholesterolemia, unspecified: Secondary | ICD-10-CM

## 2017-11-08 DIAGNOSIS — I255 Ischemic cardiomyopathy: Secondary | ICD-10-CM

## 2017-11-08 DIAGNOSIS — I2510 Atherosclerotic heart disease of native coronary artery without angina pectoris: Secondary | ICD-10-CM

## 2017-11-08 DIAGNOSIS — I10 Essential (primary) hypertension: Secondary | ICD-10-CM

## 2017-11-08 LAB — COMPREHENSIVE METABOLIC PANEL
ALBUMIN: 4.4 g/dL (ref 3.5–5.5)
ALT: 48 IU/L — ABNORMAL HIGH (ref 0–44)
AST: 24 IU/L (ref 0–40)
Albumin/Globulin Ratio: 2.6 — ABNORMAL HIGH (ref 1.2–2.2)
Alkaline Phosphatase: 42 IU/L (ref 39–117)
BUN / CREAT RATIO: 10 (ref 9–20)
BUN: 10 mg/dL (ref 6–24)
Bilirubin Total: <0.2 mg/dL (ref 0.0–1.2)
CALCIUM: 8.7 mg/dL (ref 8.7–10.2)
CHLORIDE: 110 mmol/L — AB (ref 96–106)
CO2: 21 mmol/L (ref 20–29)
CREATININE: 0.99 mg/dL (ref 0.76–1.27)
GFR, EST AFRICAN AMERICAN: 97 mL/min/{1.73_m2} (ref >59)
GFR, EST NON AFRICAN AMERICAN: 84 mL/min/{1.73_m2} (ref >59)
Globulin, Total: 1.7 g/dL (ref 1.5–4.5)
Glucose: 95 mg/dL (ref 65–99)
Potassium: 4.5 mmol/L (ref 3.5–5.2)
Sodium: 145 mmol/L — ABNORMAL HIGH (ref 134–144)
TOTAL PROTEIN: 6.1 g/dL (ref 6.0–8.5)

## 2017-11-08 LAB — LIPID PANEL
CHOL/HDL RATIO: 4.2 ratio (ref 0.0–5.0)
Cholesterol, Total: 110 mg/dL (ref 100–199)
HDL: 26 mg/dL — ABNORMAL LOW (ref >39)
LDL CALC: 67 mg/dL (ref 0–99)
Triglycerides: 87 mg/dL (ref 0–149)
VLDL CHOLESTEROL CAL: 17 mg/dL (ref 5–40)

## 2017-11-08 MED ORDER — LISINOPRIL 10 MG PO TABS: 10.0000 mg | ORAL_TABLET | Freq: Every day | ORAL | 6 refills | Status: DC

## 2017-11-08 NOTE — Progress Notes (Signed)
Cardiology Office Note   Date:  11/08/2017   ID:  Robert Dyer, DOB Apr 30, 1961, MRN [id number]  PCP:  Patient, No Pcp Per  Cardiologist:  Dr Royann Shivers, 11/05/2016 Theodore Demark, PA-C   Chief Complaint  Patient presents with  . Follow-up    pt states no chest pain no edema no SOB and no dizziness     History of Present Illness: Robert Dyer is a 57 y.o. male with a history of NSTEMII 01/2011 s/p cutting balloon PTCA RI, HTN, HLD, GERD, tob use, obesity, anxiety.  Robert Dyer presents for cardiology follow up.  He is a Psychologist, sport and exercise, is on his feet during the day and has to lift occasionally at work. Never gets CP w/ exertion.  Quit tobacco about a year ago, had gained weight since then. Is working on that.   He has some LE edema after being on his all day, does not wake with it. Denies orthopnea or PND.   He never gets palpitations, never feels his heart skip or race. No hx presyncope or syncope.  He is a heavy snorer. Does not wake up SOB, has nocturia once a night.   Notes that his BP is high today, was previously on lisinopril 5 mg, tolerated well.    Past Medical History:  Diagnosis Date  . Coronary artery disease   . GERD (gastroesophageal reflux disease)   . Hyperlipidemia   . Hypertension   . MI, acute, non ST segment elevation (HCC)    s/p cutting balloon PTCA to the ramus intermedius  . Tobacco abuse, in remission     Past Surgical History:  Procedure Laterality Date  . CARDIAC CATHETERIZATION  02/03/2011   revealing a total occlusion of the mid first diagonal with a 99% ostial stenosis of the ramus intermedius, and otherwise nonobstructive  disease.  EF was 45% with distal anterolateral akinesis.  The first  diagonal was felt to be the infarct vessel that was felt that it was  likely occluded for some time and was not likely to benefit with  reperfusion.    . CORONARY ANGIOPLASTY WITH STENT PLACEMENT  02/03/2011   s/p cutting balloon PTCA  to the ramus intermediate  . HEMORROIDECTOMY      Current Outpatient Medications  Medication Sig Dispense Refill  . ALPRAZolam (XANAX) 0.25 MG tablet Take 1 tablet (0.25 mg total) by mouth 3 (three) times daily as needed for anxiety (anxiety). 45 tablet 0  . aspirin 81 MG tablet Take 81 mg by mouth daily.      . metoprolol tartrate (LOPRESSOR) 25 MG tablet TAKE 1/2 TABLET TWICE A DAY 90 tablet 3  . nitroGLYCERIN (NITROSTAT) 0.4 MG SL tablet Place 0.4 mg under the tongue every 5 (five) minutes as needed for chest pain.    . pantoprazole (PROTONIX) 40 MG tablet TAKE 1 TABLET (40 MG TOTAL) BY MOUTH DAILY. 90 tablet 1  . rosuvastatin (CRESTOR) 10 MG tablet TAKE 1 TABLET (10 MG TOTAL) BY MOUTH DAILY. 90 tablet 3   No current facility-administered medications for this visit.     Allergies:   Statins    Social History:  The patient  reports that he quit smoking about 14 months ago. His smoking use included cigarettes. He has quit using smokeless tobacco. He reports that he drinks alcohol. He reports that he uses drugs. Drug: Marijuana.   Family History:  The patient's family history includes Heart attack in his mother; Kidney disease in his father;  Liver disease in his father.    ROS:  Please see the history of present illness. All other systems are reviewed and negative.    PHYSICAL EXAM: VS:  BP (!) 152/98   Pulse (!) 55   Ht 5\' 10"  (1.778 m)   Wt 244 lb 3.2 oz (110.8 kg)   BMI 35.04 kg/m  , BMI Body mass index is 35.04 kg/m. GEN: Well nourished, well developed, male in no acute distress  HEENT: normal for age  Neck: no JVD, no carotid bruit, no masses Cardiac: RRR; no murmur, no rubs, or gallops Respiratory: few scattered rales bilaterally, normal work of breathing GI: soft, nontender, nondistended, + BS MS: no deformity or atrophy; no edema; distal pulses are 2+ in all 4 extremities   Skin: warm and dry, no rash Neuro:  Strength and sensation are intact Psych: euthymic mood,  full affect   EKG:  EKG is ordered today. The ekg ordered today demonstrates sinus brady, HR 55, no acute ischemic changes. No change from 2018, HR 56 then.  ECHO: 08/02/2013 - Left ventricle: Distal septal and apical hypokinesis Cannot r/o calcified mural thrombus at apex Consider contrast study or MRI if clinically indicated The cavity size was mildly dilated. Wall thickness was normal. Systolic function was mildly reduced. The estimated ejection fraction was in the range of 45% to 50%. - Atrial septum: No defect or patent foramen ovale was identified.  CATH: 02/01/2011 ANGIOGRAPHIC DATA:  The right coronary artery is a small nondominant vessel and appears normal.  The left main coronary artery is very short and appears normal.  The left anterior descending artery has 20-30% disease in the proximal vessel and then extends to the apex without significant disease.  The first diagonal branch has a 100% occlusion of the midvessel.  The ramus intermediate branch has a 99% ostial stenosis.  The left circumflex coronary artery is a large dominant vessel that gives rise to several marginal vessels before terminating in the PDA. He has diffuse irregularities less than or equal to 20%.  The left PDA has a 60%-70% stenosis.  Left ventricular angiography performed in the RAO view demonstrates distal anterolateral akinesis with overall ejection fraction of 45%.  We proceeded with percutaneous intervention of the intermediate branch. It was felt that the diagonal branch was his infarct vessel, but that this had been occluded for sometime and was not likely to benefit with reperfusion.  After anticoagulation, we were able to cross the intermediate with a P[place]ater wire.  We predilated the lesion with a 2.0 x 12-mm apex balloon to 10 atmospheres.  We then placed a 2.5 x 10-mm cutting balloon and performed 2 inflations to 6 atmospheres.  This yielded an excellent  angiographic result with 0% residual stenosis and TIMI grade 3 flow.  FINAL INTERPRETATION: 1. Two-vessel obstructive atherosclerotic coronary artery disease. His infarct vessel was the first diagonal branch which is occluded     in the midvessel. 2. Mild left ventricular dysfunction. 3. Successful cutting balloon angioplasty of the ramus intermediate     branch.  PLAN:  I would continue on aspirin and Plavix given his recent infarct.  Recent Labs: No results found for requested labs within last 8760 hours.    Lipid Panel    Component Value Date/Time   CHOL 120 11/05/2016 0947   TRIG 142 11/05/2016 0947   HDL 26 (L) 11/05/2016 0947   CHOLHDL 4.6 11/05/2016 0947   VLDL 28 11/05/2016 0947   LDLCALC 66 11/05/2016 0947  Wt Readings from Last 3 Encounters:  11/08/17 244 lb 3.2 oz (110.8 kg)  11/05/16 236 lb (107 kg)  05/08/16 224 lb (101.6 kg)     Other studies Reviewed: Additional studies/ records that were reviewed today include: office notes, hospital records and testing.  ASSESSMENT AND PLAN:  1.  CAD: No anginal sx, no need for ischemic testing. Although he does not exercise, activity level is good.  Continue ASA, BB, statin, nitro prn  2. HTN: BP elevated, needs better control, will add lisinopril 10 mg qd, ck BMET 2 weeks  3. HLD: On statin, ck CMET and lipids today.  4. ICM: on BB, no need for diuretic at this time. Add ACE.  5. ?OSA: snores heavily (per his wife), no reports of apnea. Eppworth Scale is 4. MD review and advise if sleep study needed.   Current medicines are reviewed at length with the patient today.  The patient does not have concerns regarding medicines.  The following changes have been made:  Add lisinopril  Labs/ tests ordered today include:   Orders Placed This Encounter  Procedures  . Comprehensive Metabolic Panel (CMET)  . Lipid panel  . Basic Metabolic Panel (BMET)  . EKG 12-Lead     Disposition:   FU with Dr  Royann Shiversroitoru  Signed, Theodore Demarkhonda Azaan Leask, PA-C  11/08/2017 10:21 AM    [facility] Phone: [phone]; Fax: [phone]  This note was written with the assistance of speech recognition software. Please excuse any transcriptional errors.

## 2017-11-08 NOTE — Telephone Encounter (Signed)
Patient showed up to office stating he needed to be seen because his last office visit was 3.1.2018. I reviewed his chart and that was correct. Patient is being checked in.

## 2017-11-08 NOTE — Progress Notes (Signed)
With Epworth score only 4, would not expect sleep apnea, so I think no sleep study MCr

## 2017-11-08 NOTE — Patient Instructions (Signed)
Medication Instructions:  START Lisinopril 10mg  take 1 tablet once a day   Labwork: Your physician recommends that you return for lab work in: TODAY-CMET, LIPID Your physician recommends that you return for lab work in: 2 WKS (11/22/2017)-BMET  Testing/Procedures: NONE   Follow-Up: Your physician wants you to follow-up in: 12 months with Dr Royann Shiversroitoru. You will receive a reminder letter in the mail two months in advance. If you don't receive a letter, please call our office to schedule the follow-up appointment. If you have not heard from our office by January 2020 please call to schedule your next appointment.  Any Other Special Instructions Will Be Listed Below (If Applicable). If you need a refill on your cardiac medications before your next appointment, please call your pharmacy.

## 2017-11-08 NOTE — Telephone Encounter (Signed)
Left message on patients voicemail informing him that Robert LoserRhonda called from her meeting this morning to say that is patient doesn't need to be seen today. Front office staff notified.

## 2017-11-12 ENCOUNTER — Other Ambulatory Visit: Payer: Self-pay | Admitting: Cardiovascular Disease

## 2017-11-16 ENCOUNTER — Other Ambulatory Visit: Payer: Self-pay | Admitting: Cardiology

## 2017-11-22 DIAGNOSIS — E7800 Pure hypercholesterolemia, unspecified: Secondary | ICD-10-CM | POA: Diagnosis not present

## 2017-11-22 DIAGNOSIS — I214 Non-ST elevation (NSTEMI) myocardial infarction: Secondary | ICD-10-CM | POA: Diagnosis not present

## 2017-11-22 LAB — BASIC METABOLIC PANEL
BUN / CREAT RATIO: 11 (ref 9–20)
BUN: 12 mg/dL (ref 6–24)
CHLORIDE: 102 mmol/L (ref 96–106)
CO2: 21 mmol/L (ref 20–29)
Calcium: 8.9 mg/dL (ref 8.7–10.2)
Creatinine, Ser: 1.11 mg/dL (ref 0.76–1.27)
GFR, EST AFRICAN AMERICAN: 85 mL/min/{1.73_m2} (ref >59)
GFR, EST NON AFRICAN AMERICAN: 73 mL/min/{1.73_m2} (ref >59)
Glucose: 94 mg/dL (ref 65–99)
POTASSIUM: 4.6 mmol/L (ref 3.5–5.2)
Sodium: 140 mmol/L (ref 134–144)

## 2017-12-09 ENCOUNTER — Emergency Department (HOSPITAL_COMMUNITY): Payer: BLUE CROSS/BLUE SHIELD

## 2017-12-09 ENCOUNTER — Inpatient Hospital Stay (HOSPITAL_COMMUNITY)
Admission: EM | Admit: 2017-12-09 | Discharge: 2017-12-12 | DRG: 392 | Disposition: A | Discharge status: Home / Self Care | Payer: BLUE CROSS/BLUE SHIELD | Attending: Internal Medicine | Admitting: Internal Medicine

## 2017-12-09 ENCOUNTER — Encounter (HOSPITAL_COMMUNITY): Payer: Self-pay

## 2017-12-09 DIAGNOSIS — Z87891 Personal history of nicotine dependence: Secondary | ICD-10-CM | POA: Diagnosis not present

## 2017-12-09 DIAGNOSIS — I700 Atherosclerosis of aorta: Secondary | ICD-10-CM

## 2017-12-09 DIAGNOSIS — R1032 Left lower quadrant pain: Secondary | ICD-10-CM

## 2017-12-09 DIAGNOSIS — Z955 Presence of coronary angioplasty implant and graft: Secondary | ICD-10-CM

## 2017-12-09 DIAGNOSIS — K219 Gastro-esophageal reflux disease without esophagitis: Secondary | ICD-10-CM | POA: Diagnosis present

## 2017-12-09 DIAGNOSIS — R197 Diarrhea, unspecified: Secondary | ICD-10-CM | POA: Diagnosis not present

## 2017-12-09 DIAGNOSIS — Z8249 Family history of ischemic heart disease and other diseases of the circulatory system: Secondary | ICD-10-CM

## 2017-12-09 DIAGNOSIS — E860 Dehydration: Secondary | ICD-10-CM | POA: Diagnosis not present

## 2017-12-09 DIAGNOSIS — Z841 Family history of disorders of kidney and ureter: Secondary | ICD-10-CM | POA: Diagnosis not present

## 2017-12-09 DIAGNOSIS — E669 Obesity, unspecified: Secondary | ICD-10-CM | POA: Diagnosis not present

## 2017-12-09 DIAGNOSIS — I252 Old myocardial infarction: Secondary | ICD-10-CM | POA: Diagnosis not present

## 2017-12-09 DIAGNOSIS — Z6835 Body mass index (BMI) 35.0-35.9, adult: Secondary | ICD-10-CM

## 2017-12-09 DIAGNOSIS — Z7982 Long term (current) use of aspirin: Secondary | ICD-10-CM | POA: Diagnosis not present

## 2017-12-09 DIAGNOSIS — I2510 Atherosclerotic heart disease of native coronary artery without angina pectoris: Secondary | ICD-10-CM | POA: Diagnosis not present

## 2017-12-09 DIAGNOSIS — I10 Essential (primary) hypertension: Secondary | ICD-10-CM | POA: Diagnosis present

## 2017-12-09 DIAGNOSIS — K5792 Diverticulitis of intestine, part unspecified, without perforation or abscess without bleeding: Secondary | ICD-10-CM | POA: Diagnosis present

## 2017-12-09 DIAGNOSIS — E785 Hyperlipidemia, unspecified: Secondary | ICD-10-CM | POA: Diagnosis present

## 2017-12-09 DIAGNOSIS — R109 Unspecified abdominal pain: Secondary | ICD-10-CM | POA: Diagnosis not present

## 2017-12-09 DIAGNOSIS — R112 Nausea with vomiting, unspecified: Secondary | ICD-10-CM | POA: Diagnosis not present

## 2017-12-09 DIAGNOSIS — K5720 Diverticulitis of large intestine with perforation and abscess without bleeding: Principal | ICD-10-CM | POA: Diagnosis present

## 2017-12-09 HISTORY — DX: Diverticulosis of intestine, part unspecified, without perforation or abscess without bleeding: K57.90

## 2017-12-09 LAB — COMPREHENSIVE METABOLIC PANEL
ALT: 36 U/L (ref 17–63)
AST: 21 U/L (ref 15–41)
Albumin: 4.3 g/dL (ref 3.5–5.0)
Alkaline Phosphatase: 45 U/L (ref 38–126)
Anion gap: 10 (ref 5–15)
BILIRUBIN TOTAL: 1 mg/dL (ref 0.3–1.2)
BUN: 16 mg/dL (ref 6–20)
CO2: 21 mmol/L — ABNORMAL LOW (ref 22–32)
Calcium: 9 mg/dL (ref 8.9–10.3)
Chloride: 106 mmol/L (ref 101–111)
Creatinine, Ser: 1.15 mg/dL (ref 0.61–1.24)
GFR calc Af Amer: >60 mL/min (ref >60)
Glucose, Bld: 117 mg/dL — ABNORMAL HIGH (ref 65–99)
POTASSIUM: 4 mmol/L (ref 3.5–5.1)
Sodium: 137 mmol/L (ref 135–145)
TOTAL PROTEIN: 6.7 g/dL (ref 6.5–8.1)

## 2017-12-09 LAB — CBC
HEMATOCRIT: 44.9 % (ref 39.0–52.0)
Hemoglobin: 15.4 g/dL (ref 13.0–17.0)
MCH: 30.2 pg (ref 26.0–34.0)
MCHC: 34.3 g/dL (ref 30.0–36.0)
MCV: 88 fL (ref 78.0–100.0)
PLATELETS: 197 10*3/uL (ref 150–400)
RBC: 5.1 MIL/uL (ref 4.22–5.81)
RDW: 13.2 % (ref 11.5–15.5)
WBC: 12.7 10*3/uL — AB (ref 4.0–10.5)

## 2017-12-09 LAB — URINALYSIS, ROUTINE W REFLEX MICROSCOPIC
Bilirubin Urine: NEGATIVE
Glucose, UA: NEGATIVE mg/dL
Hgb urine dipstick: NEGATIVE
KETONES UR: NEGATIVE mg/dL
LEUKOCYTES UA: NEGATIVE
NITRITE: NEGATIVE
PH: 5 (ref 5.0–8.0)
PROTEIN: NEGATIVE mg/dL
Specific Gravity, Urine: 1.017 (ref 1.005–1.030)

## 2017-12-09 LAB — LIPASE, BLOOD: Lipase: 30 U/L (ref 11–51)

## 2017-12-09 MED ORDER — ONDANSETRON HCL 4 MG/2ML IJ SOLN: 4.0000 mg | Freq: Four times a day (QID) | INTRAMUSCULAR | Status: DC | PRN

## 2017-12-09 MED ORDER — ACETAMINOPHEN 325 MG PO TABS
650.0000 mg | ORAL_TABLET | Freq: Four times a day (QID) | ORAL | Status: DC | PRN
  Administered 2017-12-11: 650 mg via ORAL
  Filled 2017-12-09: qty 2

## 2017-12-09 MED ORDER — SODIUM CHLORIDE 0.9 % IV SOLN
3.0000 g | Freq: Four times a day (QID) | INTRAVENOUS | Status: DC
  Filled 2017-12-09 (×2): qty 3

## 2017-12-09 MED ORDER — IOPAMIDOL (ISOVUE-300) INJECTION 61%
INTRAVENOUS | Status: AC
  Filled 2017-12-09: qty 100

## 2017-12-09 MED ORDER — ENOXAPARIN SODIUM 40 MG/0.4ML ~~LOC~~ SOLN
40.0000 mg | SUBCUTANEOUS | Status: DC
  Administered 2017-12-09 – 2017-12-11 (×3): 40 mg via SUBCUTANEOUS
  Filled 2017-12-09 (×3): qty 0.4

## 2017-12-09 MED ORDER — TRAMADOL HCL 50 MG PO TABS: 50.0000 mg | ORAL_TABLET | Freq: Four times a day (QID) | ORAL | Status: DC | PRN

## 2017-12-09 MED ORDER — ALPRAZOLAM 0.25 MG PO TABS
0.2500 mg | ORAL_TABLET | Freq: Three times a day (TID) | ORAL | Status: DC | PRN
  Administered 2017-12-09 – 2017-12-11 (×3): 0.25 mg via ORAL
  Filled 2017-12-09 (×3): qty 1

## 2017-12-09 MED ORDER — SODIUM CHLORIDE 0.9 % IV BOLUS
1000.0000 mL | Freq: Once | INTRAVENOUS | Status: AC
  Administered 2017-12-09: 1000 mL via INTRAVENOUS

## 2017-12-09 MED ORDER — METRONIDAZOLE IN NACL 5-0.79 MG/ML-% IV SOLN
500.0000 mg | Freq: Once | INTRAVENOUS | Status: AC
  Administered 2017-12-09: 500 mg via INTRAVENOUS
  Filled 2017-12-09: qty 100

## 2017-12-09 MED ORDER — ACETAMINOPHEN 650 MG RE SUPP: 650.0000 mg | Freq: Four times a day (QID) | RECTAL | Status: DC | PRN

## 2017-12-09 MED ORDER — METRONIDAZOLE IN NACL 5-0.79 MG/ML-% IV SOLN
500.0000 mg | Freq: Three times a day (TID) | INTRAVENOUS | Status: DC
  Administered 2017-12-10 – 2017-12-12 (×7): 500 mg via INTRAVENOUS
  Filled 2017-12-09 (×9): qty 100

## 2017-12-09 MED ORDER — IOPAMIDOL (ISOVUE-300) INJECTION 61%
100.0000 mL | Freq: Once | INTRAVENOUS | Status: AC | PRN
  Administered 2017-12-09: 100 mL via INTRAVENOUS

## 2017-12-09 MED ORDER — SODIUM CHLORIDE 0.9% FLUSH
3.0000 mL | Freq: Two times a day (BID) | INTRAVENOUS | Status: DC
  Administered 2017-12-11 (×2): 3 mL via INTRAVENOUS

## 2017-12-09 MED ORDER — ACETAMINOPHEN 500 MG PO TABS
1000.0000 mg | ORAL_TABLET | Freq: Once | ORAL | Status: AC
Start: 2017-12-09 — End: 2017-12-09
  Administered 2017-12-09: 1000 mg via ORAL
  Filled 2017-12-09: qty 2

## 2017-12-09 MED ORDER — ONDANSETRON HCL 4 MG PO TABS: 4.0000 mg | ORAL_TABLET | Freq: Four times a day (QID) | ORAL | Status: DC | PRN

## 2017-12-09 MED ORDER — PANTOPRAZOLE SODIUM 40 MG PO TBEC
40.0000 mg | DELAYED_RELEASE_TABLET | Freq: Every day | ORAL | Status: DC
  Administered 2017-12-09 – 2017-12-12 (×4): 40 mg via ORAL
  Filled 2017-12-09 (×4): qty 1

## 2017-12-09 MED ORDER — MORPHINE SULFATE (PF) 4 MG/ML IV SOLN
1.0000 mg | INTRAVENOUS | Status: DC | PRN
  Administered 2017-12-09 (×2): 4 mg via INTRAVENOUS
  Filled 2017-12-09 (×2): qty 1

## 2017-12-09 MED ORDER — MORPHINE SULFATE (PF) 4 MG/ML IV SOLN
8.0000 mg | Freq: Once | INTRAVENOUS | Status: AC
  Administered 2017-12-09: 8 mg via INTRAVENOUS
  Filled 2017-12-09: qty 2

## 2017-12-09 MED ORDER — HEPARIN SODIUM (PORCINE) 5000 UNIT/ML IJ SOLN: 5000.0000 [IU] | Freq: Three times a day (TID) | INTRAMUSCULAR | Status: DC

## 2017-12-09 MED ORDER — METOPROLOL TARTRATE 12.5 MG HALF TABLET
12.5000 mg | ORAL_TABLET | Freq: Two times a day (BID) | ORAL | Status: DC
  Administered 2017-12-09 – 2017-12-12 (×7): 12.5 mg via ORAL
  Filled 2017-12-09 (×7): qty 1

## 2017-12-09 MED ORDER — CIPROFLOXACIN IN D5W 400 MG/200ML IV SOLN
400.0000 mg | Freq: Once | INTRAVENOUS | Status: AC
  Administered 2017-12-09: 400 mg via INTRAVENOUS
  Filled 2017-12-09: qty 200

## 2017-12-09 MED ORDER — GI COCKTAIL ~~LOC~~
30.0000 mL | Freq: Once | ORAL | Status: AC
Start: 2017-12-09 — End: 2017-12-09
  Administered 2017-12-09: 30 mL via ORAL
  Filled 2017-12-09: qty 30

## 2017-12-09 MED ORDER — ASPIRIN EC 81 MG PO TBEC
81.0000 mg | DELAYED_RELEASE_TABLET | Freq: Every day | ORAL | Status: DC
  Administered 2017-12-09 – 2017-12-12 (×4): 81 mg via ORAL
  Filled 2017-12-09 (×4): qty 1

## 2017-12-09 MED ORDER — SODIUM CHLORIDE 0.9 % IV SOLN
INTRAVENOUS | Status: DC
  Administered 2017-12-09 – 2017-12-10 (×3): via INTRAVENOUS

## 2017-12-09 MED ORDER — SODIUM CHLORIDE 0.9 % IV SOLN
2.0000 g | INTRAVENOUS | Status: DC
  Administered 2017-12-09 – 2017-12-11 (×3): 2 g via INTRAVENOUS
  Filled 2017-12-09 (×4): qty 20

## 2017-12-09 NOTE — ED Triage Notes (Signed)
Pt reports LLQ pain that started several days ago with diarrhea two days ago that has resolved, denies n/v/fevers

## 2017-12-09 NOTE — H&P (Addendum)
History and Physical    Robert Dyer:[id number] DOB: [birth year] DOA: 12/09/2017  **Will admit patient based on the expectation that the patient will need hospitalization/ hospital care that crosses at least 2 midnights  PCP: Patient, No Pcp Per   Attending physician: Willette Pa  Patient coming from/Resides with: Private residence  Chief Complaint: Abdominal pain  HPI: Robert Dyer is a 57 y.o. male with medical history significant for CAD with prior cardiac catheterization 2012 with cutting balloon PTCA to ramus intermedius, hypertension, dyslipidemia, GERD, diverticulosis, former tobacco abuse.  Patient developed bloating and constipation symptoms 2 days ago.  He took a laxative but within 30 minutes he had explosive diarrhea with undigested food.  Since that time he had not had a bowel movement until after presentation to the ER when he had a nonbloody bowel movement after arrival.  He has subsequently developed severe left lower quadrant abdominal pain with persistent ongoing diarrhea.  In the ER he was normotensive with somewhat suboptimal blood pressure readings in the context of patient with known hypertension, he was afebrile, without sepsis physiology and he had a low-grade leukocytosis.  CT the abdomen and pelvis revealed descending colon and sigmoid diverticulitis with pericolonic phlegmon likely related to small microperforation but no free intra-abdominal air was noted.  Patient has received Cipro and Flagyl by EDP.  Surgery has been consulted but no urgent surgical intervention indicated at this juncture.  ED Course:  Vital Signs: BP 119/78   Pulse 73   Temp 98 F (36.7 C) (Oral)   Resp 16   SpO2 95%  CT abdomen and pelvis: As above Lab data: 137, potassium 4.0, CO2 21, glucose 117, BUN 16, creatinine 1.15, anion gap 10, LFTs normal, white count 12,700 differential not obtained, hemoglobin 15.4, platelets 197,000, urinalysis unremarkable. Medications and treatments:  Tylenol 1 g x1, normal saline bolus times 1 L, GI cocktail x1 dose, Cipro 400 mg IV x1, Flagyl 500 mg IV x1, morphine 8 mg IV x1  Review of Systems:  In addition to the HPI above,  No Fever-chills, myalgias or other constitutional symptoms No Headache, changes with Vision or hearing, new weakness, tingling, numbness in any extremity, dizziness, dysarthria or word finding difficulty, gait disturbance or imbalance, tremors or seizure activity No problems swallowing food or Liquids, indigestion/reflux, choking or coughing while eating, abdominal pain with or after eating No Chest pain, Cough or Shortness of Breath, palpitations, orthopnea or DOE No melena,hematochezia, dark tarry stools No dysuria, malodorous urine, hematuria or flank pain No new skin rashes, lesions, masses or bruises, No new joint pains, aches, swelling or redness No recent unintentional weight gain or loss No polyuria, polydypsia or polyphagia   Past Medical History:  Diagnosis Date  . Coronary artery disease   . Diverticulosis   . GERD (gastroesophageal reflux disease)   . Hyperlipidemia   . Hypertension   . MI, acute, non ST segment elevation (HCC)    s/p cutting balloon PTCA to the ramus intermedius  . Tobacco abuse, in remission     Past Surgical History:  Procedure Laterality Date  . CARDIAC CATHETERIZATION  02/03/2011   revealing a total occlusion of the mid first diagonal with a 99% ostial stenosis of the ramus intermedius, and otherwise nonobstructive  disease.  EF was 45% with distal anterolateral akinesis.  The first  diagonal was felt to be the infarct vessel that was felt that it was  likely occluded for some time and was not likely to benefit  with  reperfusion.    . COLONOSCOPY  2012   Dr. Elnoria Howard  . CORONARY ANGIOPLASTY WITH STENT PLACEMENT  02/03/2011   s/p cutting balloon PTCA to the ramus intermediate  . HEMORROIDECTOMY      Social History   Socioeconomic History  . Marital status: Married     Spouse name: Not on file  . Number of children: Not on file  . Years of education: Not on file  . Highest education level: Not on file  Occupational History  . Not on file  Social Needs  . Financial resource strain: Not on file  . Food insecurity:    Worry: Not on file    Inability: Not on file  . Transportation needs:    Medical: Not on file    Non-medical: Not on file  Tobacco Use  . Smoking status: Former Smoker    Types: Cigarettes    Last attempt to quit: 09/07/2016    Years since quitting: 1.2  . Smokeless tobacco: Former Engineer, water and Sexual Activity  . Alcohol use: Yes  . Drug use: Yes    Types: Marijuana  . Sexual activity: Not on file  Lifestyle  . Physical activity:    Days per week: Not on file    Minutes per session: Not on file  . Stress: Not on file  Relationships  . Social connections:    Talks on phone: Not on file    Gets together: Not on file    Attends religious service: Not on file    Active member of club or organization: Not on file    Attends meetings of clubs or organizations: Not on file    Relationship status: Not on file  . Intimate partner violence:    Fear of current or ex partner: Not on file    Emotionally abused: Not on file    Physically abused: Not on file    Forced sexual activity: Not on file  Other Topics Concern  . Not on file  Social History Narrative  . Not on file    Mobility: Independent Work history: Psychologist, sport and exercise   Allergies  Allergen Reactions  . Statins Rash    Family History  Problem Relation Age of Onset  . Heart attack Mother   . Liver disease Father   . Kidney disease Father      Prior to Admission medications   Medication Sig Start Date End Date Taking? Authorizing Provider  ALPRAZolam (XANAX) 0.25 MG tablet Take 1 tablet (0.25 mg total) by mouth 3 (three) times daily as needed for anxiety (anxiety). 11/05/16  Yes Croitoru, Mihai, MD  aspirin 81 MG tablet Take 81 mg by mouth daily.     Yes  [provider]  lisinopril (PRINIVIL,ZESTRIL) 10 MG tablet Take 1 tablet (10 mg total) by mouth daily. 11/08/17 06/06/18 Yes Barrett, Joline Salt, PA-C  metoprolol tartrate (LOPRESSOR) 25 MG tablet TAKE 1/2 TABLET TWICE A DAY 01/13/17  Yes Croitoru, Mihai, MD  nitroGLYCERIN (NITROSTAT) 0.4 MG SL tablet Place 0.4 mg under the tongue every 5 (five) minutes as needed for chest pain.   Yes [provider]  pantoprazole (PROTONIX) 40 MG tablet TAKE 1 TABLET (40 MG TOTAL) BY MOUTH DAILY. 11/16/17  Yes Croitoru, Mihai, MD  rosuvastatin (CRESTOR) 10 MG tablet TAKE 1 TABLET (10 MG TOTAL) BY MOUTH DAILY. 11/12/17  Yes Croitoru, Mihai, MD  pantoprazole (PROTONIX) 40 MG tablet TAKE 1 TABLET (40 MG TOTAL) BY MOUTH DAILY. Patient not  taking: Reported on 12/09/2017 02/22/17   Croitoru, Rachelle Hora, MD    Physical Exam: Vitals:   12/09/17 1000 12/09/17 1015 12/09/17 1044 12/09/17 1045  BP: 123/77 126/64 114/81 119/78  Pulse: 82 74 79 73  Resp: 16 16  16   Temp:      TempSrc:      SpO2: 96% 96% 96% 95%      Constitutional: NAD, calm, comfortable Eyes: PERRL, lids and conjunctivae normal ENMT: Mucous membranes are moist. Posterior pharynx clear of any exudate or lesions.Normal dentition.  Neck: normal, supple, no masses, no thyromegaly Respiratory: clear to auscultation bilaterally, no wheezing, no crackles. Normal respiratory effort. No accessory muscle use.  Cardiovascular: Regular rate and rhythm, no murmurs / rubs / gallops. No extremity edema. 2+ pedal pulses. No carotid bruits.  Abdomen: Focal significant LLQ tenderness with guarding but no rebounding after light palpation, abd soft but slightly distended -no masses palpated. No hepatosplenomegaly. Bowel sounds positive.  Musculoskeletal: no clubbing / cyanosis. No joint deformity upper and lower extremities. Good ROM, no contractures. Normal muscle tone.  Skin: no rashes, lesions, ulcers. No induration Neurologic: CN 2-12 grossly intact. Sensation  intact, DTR normal. Strength 5/5 x all 4 extremities.  Psychiatric: Normal judgment and insight. Alert and oriented x 3. Normal mood.    Labs on Admission: I have personally reviewed following labs and imaging studies  CBC: Recent Labs  Lab 12/09/17 0142  WBC 12.7*  HGB 15.4  HCT 44.9  MCV 88.0  PLT 197   Basic Metabolic Panel: Recent Labs  Lab 12/09/17 0142  NA 137  K 4.0  CL 106  CO2 21*  GLUCOSE 117*  BUN 16  CREATININE 1.15  CALCIUM 9.0   GFR: CrCl cannot be calculated (Unknown ideal weight.). Liver Function Tests: Recent Labs  Lab 12/09/17 0142  AST 21  ALT 36  ALKPHOS 45  BILITOT 1.0  PROT 6.7  ALBUMIN 4.3   Recent Labs  Lab 12/09/17 0142  LIPASE 30   No results for input(s): AMMONIA in the last 168 hours. Coagulation Profile: No results for input(s): INR, PROTIME in the last 168 hours. Cardiac Enzymes: No results for input(s): CKTOTAL, CKMB, CKMBINDEX, TROPONINI in the last 168 hours. BNP (last 3 results) No results for input(s): PROBNP in the last 8760 hours. HbA1C: No results for input(s): HGBA1C in the last 72 hours. CBG: No results for input(s): GLUCAP in the last 168 hours. Lipid Profile: No results for input(s): CHOL, HDL, LDLCALC, TRIG, CHOLHDL, LDLDIRECT in the last 72 hours. Thyroid Function Tests: No results for input(s): TSH, T4TOTAL, FREET4, T3FREE, THYROIDAB in the last 72 hours. Anemia Panel: No results for input(s): VITAMINB12, FOLATE, FERRITIN, TIBC, IRON, RETICCTPCT in the last 72 hours. Urine analysis:    Component Value Date/Time   COLORURINE YELLOW 12/09/2017 0139   APPEARANCEUR CLEAR 12/09/2017 0139   LABSPEC 1.017 12/09/2017 0139   PHURINE 5.0 12/09/2017 0139   GLUCOSEU NEGATIVE 12/09/2017 0139   HGBUR NEGATIVE 12/09/2017 0139   BILIRUBINUR NEGATIVE 12/09/2017 0139   KETONESUR NEGATIVE 12/09/2017 0139   PROTEINUR NEGATIVE 12/09/2017 0139   NITRITE NEGATIVE 12/09/2017 0139   LEUKOCYTESUR NEGATIVE 12/09/2017 0139     Sepsis Labs: @LABRCNTIP (procalcitonin:4,lacticidven:4) )No results found for this or any previous visit (from the past 240 hour(s)).   Radiological Exams on Admission: Ct Abdomen Pelvis W Contrast  Result Date: 12/09/2017 CLINICAL DATA:  Abdominal pain.  LEFT lower quadrant pain. EXAM: CT ABDOMEN AND PELVIS WITH CONTRAST TECHNIQUE: Multidetector CT imaging of  the abdomen and pelvis was performed using the standard protocol following bolus administration of intravenous contrast. CONTRAST:  100mL ISOVUE-300 IOPAMIDOL (ISOVUE-300) INJECTION 61% COMPARISON:  None. FINDINGS: Lower chest: No acute abnormality. RIGHT infrahilar air cyst, incompletely evaluated. Hepatobiliary: No focal liver abnormality is seen. No gallstones, gallbladder wall thickening, or biliary dilatation. Pancreas: Unremarkable. No pancreatic ductal dilatation or surrounding inflammatory changes. Spleen: Normal in size without focal abnormality. Adrenals/Urinary Tract: Adrenal glands are unremarkable. Kidneys are normal, without renal calculi, focal lesion, or hydronephrosis. Bladder is unremarkable. Stomach/Bowel: Normal stomach, small bowel, and appendix. Extensive colonic diverticulosis. Colonic diverticulitis affecting the descending colon and sigmoid regions, with pericolonic stranding and a small amount of poorly contained fluid adjacent to the peritoneal reflection extending along the anterior margin of the psoas within the retroperitoneum, consistent with phlegmon. Tiny microperforation is likely, see series 6, image 81. Vascular/Lymphatic: Aortic atherosclerosis. No enlarged abdominal or pelvic lymph nodes. Reproductive: Prostate is unremarkable. Other: BILATERAL fat containing inguinal hernias. No abdominopelvic ascites. Musculoskeletal: No acute or significant osseous findings. IMPRESSION: Descending colon and sigmoid colon diverticulitis, with pericolonic phlegmon, likely small microperforation, but no free intra-abdominal air.  Aortic Atherosclerosis (ICD10-I70.0). Electronically Signed   By: Elsie StainJohn T Curnes M.D.   On: 12/09/2017 09:35     Assessment/Plan Principal Problem:   Acute diverticulitis with phlegmon -History of known diverticulosis presents with 2 days of abdominal bloating and left lower quadrant abdominal pain and leukocytosis with CT revealing acute descending and sigmoid diverticulitis associated phlegmon concerning for microperforation -Surgery consulted-no acute surgical needs identified -Antibiotics discussed with surgical team; given current antibiogram in regards to treatment of diverticulitis surgery recommends IV Rocephin and Flagyl -Given phlegmon likely will need repeat imaging to ensure has not evolved into abscess which may require evaluation by IR for possible percutaneous drainage -NPO -IV morphine for severe pain -PO Ultram for moderate pain -Tylenol for fever and mild pain -If develops fever greater than 101.5 F will need to obtain blood cultures  Active Problems:   Coronary artery disease -Currently asymptomatic -Hold statin while on clear liquids -Continue aspirin -Continue beta-blocker but at lower dose in context of current dehydration and suboptimal blood pressure readings    Hypertension -Genuine beta-blocker but holding ACE inhibitor in context of suboptimal blood pressure readings    GERD (gastroesophageal reflux disease) -Continue PPI    Obesity -Weight reduction strategies per PCP -Patient apparently reporting snoring and likely would benefit from outpatient PSG    **Additional lab, imaging and/or diagnostic evaluation at discretion of supervising physician  DVT prophylaxis: SQ heparin which is shorter acting in the event patient needs to be taken urgently for surgical procedure Code Status: Full Family Communication: No family at bedside Disposition Plan: Home Consults called: Surgery/[place]    ELLIS,ALLISON L. ANP-BC [facility] Pager  [phone]   If 7PM-7AM, please contact night-coverage [email or web address] Password [email or web address]  12/09/2017, 11:44 AM

## 2017-12-09 NOTE — Consult Note (Addendum)
[facility] Surgery Consult Note  ARMEN WARING 06-Feb-1961  [id number].    Requesting MD: Mila Merry Chief Complaint/Reason for Consult: diverticulitis  HPI:  Robert Dyer is a 57yo male who presented to the ED earlier today with 3 days of worsening LLQ abdominal pain. States that he has had similar pain 2-3 times over the last 6 months but it would resolve on its own. This time his pain progressively got worse so he came to the ED. Pain is constant and severe. Worse with PO intake. Denies n/v, fever, chills, dysuria. He does report having diarrhea 3 days ago.  In the ED his CT scan showed descending colon and sigmoid colon diverticulitis with pericolonic phlegmon, likely small microperforation, but no free intra-abdominal air. WBC 12.7, afebrile. Last colonoscopy was 2012 by Dr. Benson Norway; per patient it showed "pockets" but he does not know where in the colon they were located.  PMH significant for HTN, HLD, GERD, CAD, h/o NSTEMI 2012 s/p cutting balloon angioplasty Abdominal surgical history: none Anticoagulants: none Former smoker, quit 1 year ago Employment: Dealer  ROS: Review of Systems  Constitutional: Negative.   HENT: Negative.   Eyes: Negative.   Respiratory: Negative.   Cardiovascular: Negative.   Gastrointestinal: Positive for abdominal pain and diarrhea. Negative for constipation, nausea and vomiting.  Genitourinary: Negative.   Musculoskeletal: Negative.   Skin: Negative.   Neurological: Negative.    All systems reviewed and otherwise negative except for as above  Family History  Problem Relation Age of Onset  . Heart attack Mother   . Liver disease Father   . Kidney disease Father     Past Medical History:  Diagnosis Date  . Coronary artery disease   . GERD (gastroesophageal reflux disease)   . Hyperlipidemia   . Hypertension   . MI, acute, non ST segment elevation (HCC)    s/p cutting balloon PTCA to the ramus intermedius  . Tobacco  abuse, in remission     Past Surgical History:  Procedure Laterality Date  . CARDIAC CATHETERIZATION  02/03/2011   revealing a total occlusion of the mid first diagonal with a 99% ostial stenosis of the ramus intermedius, and otherwise nonobstructive  disease.  EF was 45% with distal anterolateral akinesis.  The first  diagonal was felt to be the infarct vessel that was felt that it was  likely occluded for some time and was not likely to benefit with  reperfusion.    . CORONARY ANGIOPLASTY WITH STENT PLACEMENT  02/03/2011   s/p cutting balloon PTCA to the ramus intermediate  . HEMORROIDECTOMY      Social History:  reports that he quit smoking about 15 months ago. His smoking use included cigarettes. He has quit using smokeless tobacco. He reports that he drinks alcohol. He reports that he has current or past drug history. Drug: Marijuana.  Allergies:  Allergies  Allergen Reactions  . Statins Rash     (Not in a hospital admission)  Prior to Admission medications   Medication Sig Start Date End Date Taking? Authorizing Provider  ALPRAZolam (XANAX) 0.25 MG tablet Take 1 tablet (0.25 mg total) by mouth 3 (three) times daily as needed for anxiety (anxiety). 11/05/16  Yes Croitoru, Mihai, MD  aspirin 81 MG tablet Take 81 mg by mouth daily.     Yes [provider]  lisinopril (PRINIVIL,ZESTRIL) 10 MG tablet Take 1 tablet (10 mg total) by mouth daily. 11/08/17 06/06/18 Yes Barrett, Evelene Croon, PA-C  metoprolol tartrate (LOPRESSOR)  25 MG tablet TAKE 1/2 TABLET TWICE A DAY 01/13/17  Yes Croitoru, Mihai, MD  nitroGLYCERIN (NITROSTAT) 0.4 MG SL tablet Place 0.4 mg under the tongue every 5 (five) minutes as needed for chest pain.   Yes [provider]  pantoprazole (PROTONIX) 40 MG tablet TAKE 1 TABLET (40 MG TOTAL) BY MOUTH DAILY. 11/16/17  Yes Croitoru, Mihai, MD  rosuvastatin (CRESTOR) 10 MG tablet TAKE 1 TABLET (10 MG TOTAL) BY MOUTH DAILY. 11/12/17  Yes Croitoru, Mihai, MD  pantoprazole  (PROTONIX) 40 MG tablet TAKE 1 TABLET (40 MG TOTAL) BY MOUTH DAILY. Patient not taking: Reported on 12/09/2017 02/22/17   Croitoru, Mihai, MD    Blood pressure 119/78, pulse 73, temperature 98 F (36.7 C), temperature source Oral, resp. rate 16, SpO2 95 %. Physical Exam: General: pleasant, WD/WN white male who is laying in bed in NAD HEENT: head is normocephalic, atraumatic.  Sclera are noninjected.  Pupils equal and round.  Ears and nose without any masses or lesions.  Mouth is pink and moist. Dentition fair Heart: regular, rate, and rhythm.  No obvious murmurs, gallops, or rubs noted.  Palpable pedal pulses bilaterally Lungs: CTAB, no wheezes, rhonchi, or rales noted.  Respiratory effort nonlabored Abd: obese, soft, ?distended, TTP LLQ with guarding, +BS, no masses, hernias, or organomegaly MS: all 4 extremities are symmetrical with no cyanosis, clubbing, or edema. Skin: warm and dry with no masses, lesions, or rashes Psych: A&Ox3 with an appropriate affect. Neuro: cranial nerves grossly intact, extremity CSM intact bilaterally, normal speech  Results for orders placed or performed during the hospital encounter of 12/09/17 (from the past 48 hour(s))  Urinalysis, Routine w reflex microscopic     Status: None   Collection Time: 12/09/17  1:39 AM  Result Value Ref Range   Color, Urine YELLOW YELLOW   APPearance CLEAR CLEAR   Specific Gravity, Urine 1.017 1.005 - 1.030   pH 5.0 5.0 - 8.0   Glucose, UA NEGATIVE NEGATIVE mg/dL   Hgb urine dipstick NEGATIVE NEGATIVE   Bilirubin Urine NEGATIVE NEGATIVE   Ketones, ur NEGATIVE NEGATIVE mg/dL   Protein, ur NEGATIVE NEGATIVE mg/dL   Nitrite NEGATIVE NEGATIVE   Leukocytes, UA NEGATIVE NEGATIVE    Comment: Performed at [facility] [address], [place], [place] [address]  Lipase, blood     Status: None   Collection Time: 12/09/17  1:42 AM  Result Value Ref Range   Lipase 30 11 - 51 U/L    Comment: Performed at [facility] [address] [place], [place] [address]  Comprehensive metabolic panel     Status: Abnormal   Collection Time: 12/09/17  1:42 AM  Result Value Ref Range   Sodium 137 135 - 145 mmol/L   Potassium 4.0 3.5 - 5.1 mmol/L   Chloride 106 101 - 111 mmol/L   CO2 21 (L) 22 - 32 mmol/L   Glucose, Bld 117 (H) 65 - 99 mg/dL   BUN 16 6 - 20 mg/dL   Creatinine, Ser 1.15 0.61 - 1.24 mg/dL   Calcium 9.0 8.9 - 10.3 mg/dL   Total Protein 6.7 6.5 - 8.1 g/dL   Albumin 4.3 3.5 - 5.0 g/dL   AST 21 15 - 41 U/L   ALT 36 17 - 63 U/L   Alkaline Phosphatase 45 38 - 126 U/L   Total Bilirubin 1.0 0.3 - 1.2 mg/dL   GFR calc non Af Amer >60 >60 mL/min   GFR calc Af  Amer >60 >60 mL/min    Comment: (NOTE) The eGFR has been calculated using the CKD EPI equation. This calculation has not been validated in all clinical situations. eGFR's persistently <60 mL/min signify possible Chronic Kidney Disease.    Anion gap 10 5 - 15    Comment: Performed at [facility] [address], [place], [place] [address]  CBC     Status: Abnormal   Collection Time: 12/09/17  1:42 AM  Result Value Ref Range   WBC 12.7 (H) 4.0 - 10.5 K/uL   RBC 5.10 4.22 - 5.81 MIL/uL   Hemoglobin 15.4 13.0 - 17.0 g/dL   HCT 44.9 39.0 - 52.0 %   MCV 88.0 78.0 - 100.0 fL   MCH 30.2 26.0 - 34.0 pg   MCHC 34.3 30.0 - 36.0 g/dL   RDW 13.2 11.5 - 15.5 %   Platelets 197 150 - 400 K/uL    Comment: Performed at [facility], [place] [address] [place], [place] [address]   Ct Abdomen Pelvis W Contrast  Result Date: 12/09/2017 CLINICAL DATA:  Abdominal pain.  LEFT lower quadrant pain. EXAM: CT ABDOMEN AND PELVIS WITH CONTRAST TECHNIQUE: Multidetector CT imaging of the abdomen and pelvis was performed using the standard protocol following bolus administration of intravenous contrast. CONTRAST:  123m ISOVUE-300 IOPAMIDOL (ISOVUE-300) INJECTION 61% COMPARISON:  None. FINDINGS: Lower chest: No acute abnormality. RIGHT infrahilar air cyst, incompletely  evaluated. Hepatobiliary: No focal liver abnormality is seen. No gallstones, gallbladder wall thickening, or biliary dilatation. Pancreas: Unremarkable. No pancreatic ductal dilatation or surrounding inflammatory changes. Spleen: Normal in size without focal abnormality. Adrenals/Urinary Tract: Adrenal glands are unremarkable. Kidneys are normal, without renal calculi, focal lesion, or hydronephrosis. Bladder is unremarkable. Stomach/Bowel: Normal stomach, small bowel, and appendix. Extensive colonic diverticulosis. Colonic diverticulitis affecting the descending colon and sigmoid regions, with pericolonic stranding and a small amount of poorly contained fluid adjacent to the peritoneal reflection extending along the anterior margin of the psoas within the retroperitoneum, consistent with phlegmon. Tiny microperforation is likely, see series 6, image 81. Vascular/Lymphatic: Aortic atherosclerosis. No enlarged abdominal or pelvic lymph nodes. Reproductive: Prostate is unremarkable. Other: BILATERAL fat containing inguinal hernias. No abdominopelvic ascites. Musculoskeletal: No acute or significant osseous findings. IMPRESSION: Descending colon and sigmoid colon diverticulitis, with pericolonic phlegmon, likely small microperforation, but no free intra-abdominal air. Aortic Atherosclerosis (ICD10-I70.0). Electronically Signed   By: JStaci RighterM.D.   On: 12/09/2017 09:35   Anti-infectives (From admission, onward)   Start     Dose/Rate Route Frequency Ordered Stop   12/09/17 1100  Ampicillin-Sulbactam (UNASYN) 3 g in sodium chloride 0.9 % 100 mL IVPB     3 g 200 mL/hr over 30 Minutes Intravenous Every 6 hours 12/09/17 1050     12/09/17 1030  ciprofloxacin (CIPRO) IVPB 400 mg     400 mg 200 mL/hr over 60 Minutes Intravenous  Once 12/09/17 1016     12/09/17 1030  metroNIDAZOLE (FLAGYL) IVPB 500 mg     500 mg 100 mL/hr over 60 Minutes Intravenous  Once 12/09/17 1016           Assessment/Plan HTN HLD GERD CAD H/o NSTEMI 2012 s/p cutting balloon angioplasty  Acute diverticulitis with microperforation - 1st bout of diverticulitis requiring treatment - CT scan shows descending colon and sigmoid colon diverticulitis with pericolonic phlegmon, likely small microperforation, but no free intra-abdominal air.  - WBC 12.7, afebrile. - Last colonoscopy was 2012 by Dr. HBenson Norway per patient  it showed "pockets" but he does not know where in the colon they were located  ID - cipro/flagyl x1 in ED 4/4, rocephin/flagyl 4/4>> VTE - SCDs, heparin FEN - IVF, NPO Foley - none Follow up - TBD  Plan - Patient does not require acute surgical intervention. Recommend admit to medicine. IV rocephin/flagyl and bowel rest. Repeat labs in AM. Once WBC normalizes and pain resolves ok to start advancing diet. If patient improves would recommend colonoscopy in 6-8 weeks.  Wellington Hampshire, [facility] Surgery 12/09/2017, 10:52 AM Pager: [phone] Consults: [phone] Mon-Fri 7:00 am-4:30 pm Sat-Sun 7:00 am-11:30 am

## 2017-12-09 NOTE — Progress Notes (Signed)
Pharmacy Antibiotic Note  Robert Dyer is a 57 y.o. male admitted on 12/09/2017 with diverticulitis.  Pharmacy has been consulted for ceftriaxone dosing. Pt is here with abdominal pain and CT showed descending colon and sigmoid colon diverticulitis. WBC 12.7  Plan: -Start ceftriaxone 2 gm IV Q 24 hours -Monitor CBC, clinical progress -Pharmacy to sign off since no further dosage adjustments necessary      Temp (24hrs), Avg:98 F (36.7 C), Min:98 F (36.7 C), Max:98 F (36.7 C)  Recent Labs  Lab 12/09/17 0142  WBC 12.7*  CREATININE 1.15    CrCl cannot be calculated (Unknown ideal weight.).    Allergies  Allergen Reactions  . Statins Rash     Thank you for allowing pharmacy to be a part of this patient's care.  Vinnie LevelBenjamin Charice Zuno, PharmD., BCPS Clinical Pharmacist Clinical phone for 12/09/17 until 3:30pm: [phone]

## 2017-12-09 NOTE — ED Notes (Signed)
Patient transported to CT 

## 2017-12-09 NOTE — ED Provider Notes (Signed)
10:21 AM Assumed care at change of shift. CT does show diverticulitis with adjacent phlegmon and possible microperforation. Antibiotics. Discussed with surgery. Pt now agreeable to pain medication.    Robert Dyer, Robert Lal, MD 12/09/17 1024

## 2017-12-09 NOTE — ED Notes (Signed)
Surgery at bedside.

## 2017-12-09 NOTE — ED Provider Notes (Signed)
TIME SEEN: 6:26 AM  CHIEF COMPLAINT: Abdominal pain  HPI: Patient is a 57 year old male with history of hypertension, hyperlipidemia, CAD who presents to the emergency department with left lower quadrant pain that is sharp in nature and intermittent for the past several days.  States that he initially had nausea, vomiting and diarrhea 2-3 days ago but this has now resolved.  Has not had a bowel movement in the past 2 days.  No bloody stools or melena.  No previous history of abdominal surgery.  No dysuria, hematuria, testicular pain or swelling.  Had a colonoscopy at age 57 by Dr. Elnoria HowardHung and states he had polyps.  He is not aware if he had diverticulosis.  Has never had diverticulitis before.  No known aggravating or relieving factors.  No radiation of pain.  ROS: See HPI Constitutional: no fever  Eyes: no drainage  ENT: no runny nose   Cardiovascular:  no chest pain  Resp: no SOB  GI: no vomiting GU: no dysuria Integumentary: no rash  Allergy: no hives  Musculoskeletal: no leg swelling  Neurological: no slurred speech ROS otherwise negative  PAST MEDICAL HISTORY/PAST SURGICAL HISTORY:  Past Medical History:  Diagnosis Date  . Coronary artery disease   . GERD (gastroesophageal reflux disease)   . Hyperlipidemia   . Hypertension   . MI, acute, non ST segment elevation (HCC)    s/p cutting balloon PTCA to the ramus intermedius  . Tobacco abuse, in remission     MEDICATIONS:  Prior to Admission medications   Medication Sig Start Date End Date Taking? Authorizing Provider  ALPRAZolam (XANAX) 0.25 MG tablet Take 1 tablet (0.25 mg total) by mouth 3 (three) times daily as needed for anxiety (anxiety). 11/05/16   Croitoru, Mihai, MD  aspirin 81 MG tablet Take 81 mg by mouth daily.      [provider]  lisinopril (PRINIVIL,ZESTRIL) 10 MG tablet Take 1 tablet (10 mg total) by mouth daily. 11/08/17 06/06/18  Barrett, Joline Salthonda G, PA-C  metoprolol tartrate (LOPRESSOR) 25 MG tablet TAKE 1/2  TABLET TWICE A DAY 01/13/17   Croitoru, Mihai, MD  nitroGLYCERIN (NITROSTAT) 0.4 MG SL tablet Place 0.4 mg under the tongue every 5 (five) minutes as needed for chest pain.    [provider]  pantoprazole (PROTONIX) 40 MG tablet TAKE 1 TABLET (40 MG TOTAL) BY MOUTH DAILY. 02/22/17   Croitoru, Mihai, MD  pantoprazole (PROTONIX) 40 MG tablet TAKE 1 TABLET (40 MG TOTAL) BY MOUTH DAILY. 11/16/17   Croitoru, Mihai, MD  rosuvastatin (CRESTOR) 10 MG tablet TAKE 1 TABLET (10 MG TOTAL) BY MOUTH DAILY. 11/12/17   Croitoru, Rachelle HoraMihai, MD    ALLERGIES:  Allergies  Allergen Reactions  . Statins Rash    SOCIAL HISTORY:  Social History   Tobacco Use  . Smoking status: Former Smoker    Types: Cigarettes    Last attempt to quit: 09/07/2016    Years since quitting: 1.2  . Smokeless tobacco: Former Engineer, waterUser  Substance Use Topics  . Alcohol use: Yes    FAMILY HISTORY: Family History  Problem Relation Age of Onset  . Heart attack Mother   . Liver disease Father   . Kidney disease Father     EXAM: BP (!) 132/98   Pulse (!) 112   Temp 98 F (36.7 C) (Oral)   Resp 20   SpO2 98%  CONSTITUTIONAL: Alert and oriented and responds appropriately to questions. Well-appearing; well-nourished, obese, in no significant distress HEAD: Normocephalic EYES: Conjunctivae  clear, pupils appear equal, EOMI ENT: normal nose; moist mucous membranes NECK: Supple, no meningismus, no nuchal rigidity, no LAD  CARD: Regular and tachycardic; S1 and S2 appreciated; no murmurs, no clicks, no rubs, no gallops RESP: Normal chest excursion without splinting or tachypnea; breath sounds clear and equal bilaterally; no wheezes, no rhonchi, no rales, no hypoxia or respiratory distress, speaking full sentences ABD/GI: Normal bowel sounds; non-distended; soft, tender to palpation in the left lower quadrant, no rebound, no guarding, no peritoneal signs, no hepatosplenomegaly BACK:  The back appears normal and is non-tender to  palpation, there is no CVA tenderness EXT: Normal ROM in all joints; non-tender to palpation; no edema; normal capillary refill; no cyanosis, no calf tenderness or swelling    SKIN: Normal color for age and race; warm; no rash NEURO: Moves all extremities equally PSYCH: The patient's mood and manner are appropriate. Grooming and personal hygiene are appropriate.  MEDICAL DECISION MAKING: Patient here with left lower quadrant abdominal pain.  Suspect diverticulitis.  Differential also includes kidney stone, colitis, less likely bowel obstruction or appendicitis.  Labs show mild leukocytosis which appears to be chronic.  Otherwise unremarkable.  Urine shows no sign of infection or blood.  Plan is to obtain CT scan for further evaluation.  He did drive himself to the emergency department and has no one to take him home.  He states he is okay with Tylenol for pain at this time and no narcotics.  Will hydrate patient.  ED PROGRESS: Signed out to Dr. Juleen China to follow up on patient's CTAP.  I reviewed all nursing notes, vitals, pertinent previous records, EKGs, lab and urine results, imaging (as available).      Alok Minshall, Layla Maw, DO 12/09/17 [phone]

## 2017-12-10 ENCOUNTER — Other Ambulatory Visit: Payer: Self-pay

## 2017-12-10 DIAGNOSIS — I700 Atherosclerosis of aorta: Secondary | ICD-10-CM

## 2017-12-10 DIAGNOSIS — I10 Essential (primary) hypertension: Secondary | ICD-10-CM

## 2017-12-10 LAB — COMPREHENSIVE METABOLIC PANEL
ALK PHOS: 34 U/L — AB (ref 38–126)
ALT: 23 U/L (ref 17–63)
AST: 14 U/L — ABNORMAL LOW (ref 15–41)
Albumin: 3.3 g/dL — ABNORMAL LOW (ref 3.5–5.0)
Anion gap: 11 (ref 5–15)
BILIRUBIN TOTAL: 0.8 mg/dL (ref 0.3–1.2)
BUN: 10 mg/dL (ref 6–20)
CALCIUM: 8.4 mg/dL — AB (ref 8.9–10.3)
CO2: 23 mmol/L (ref 22–32)
CREATININE: 1.06 mg/dL (ref 0.61–1.24)
Chloride: 103 mmol/L (ref 101–111)
Glucose, Bld: 87 mg/dL (ref 65–99)
Potassium: 3.9 mmol/L (ref 3.5–5.1)
Sodium: 137 mmol/L (ref 135–145)
TOTAL PROTEIN: 5.8 g/dL — AB (ref 6.5–8.1)

## 2017-12-10 LAB — HIV ANTIBODY (ROUTINE TESTING W REFLEX): HIV Screen 4th Generation wRfx: NONREACTIVE

## 2017-12-10 LAB — CBC
HCT: 40.2 % (ref 39.0–52.0)
HEMOGLOBIN: 13.3 g/dL (ref 13.0–17.0)
MCH: 29.6 pg (ref 26.0–34.0)
MCHC: 33.1 g/dL (ref 30.0–36.0)
MCV: 89.3 fL (ref 78.0–100.0)
PLATELETS: 172 10*3/uL (ref 150–400)
RBC: 4.5 MIL/uL (ref 4.22–5.81)
RDW: 13.1 % (ref 11.5–15.5)
WBC: 8.7 10*3/uL (ref 4.0–10.5)

## 2017-12-10 NOTE — Progress Notes (Signed)
[facility] Progress Note     Subjective: CC-  Patient states that he still has some mild LLQ abdominal pain, but it is much improved from yesterday. Denies n/v. No BM since admission. WBC 8.7, afebrile.  Objective: Vital signs in last 24 hours: Temp:  [98.4 F (36.9 C)-99.1 F (37.3 C)] 98.8 F (37.1 C) (04/05 0437) Pulse Rate:  [57-82] 78 (04/05 0437) Resp:  [16-18] 17 (04/05 0437) BP: (100-126)/(60-85) 114/66 (04/05 0437) SpO2:  [93 %-100 %] 97 % (04/05 0437) Weight:  [239 lb 6.7 oz (108.6 kg)] 239 lb 6.7 oz (108.6 kg) (04/04 1400) Last BM Date: 12/09/17  Intake/Output from previous day: 04/04 0701 - 04/05 0700 In: 2796.7 [P.O.:50; I.V.:1646.7; IV Piggyback:1100] Out: 1100 [Urine:1100] Intake/Output this shift: No intake/output data recorded.  PE: Gen:  Alert, NAD, pleasant HEENT: EOM's intact, pupils equal and round Card:  RRR, no M/G/R heard Pulm:  CTAB, no W/R/R, effort normal Abd: obese, soft, minimal TTP LLQ without rebound or guarding, +BS, no HSM, no hernia Ext:  No erythema, edema, or tenderness BUE/BLE  Psych: A&Ox3  Skin: no rashes noted, warm and dry  Lab Results:  Recent Labs    12/09/17 0142 12/10/17 0635  WBC 12.7* 8.7  HGB 15.4 13.3  HCT 44.9 40.2  PLT 197 172   BMET Recent Labs    12/09/17 0142  NA 137  K 4.0  CL 106  CO2 21*  GLUCOSE 117*  BUN 16  CREATININE 1.15  CALCIUM 9.0   PT/INR No results for input(s): LABPROT, INR in the last 72 hours. CMP     Component Value Date/Time   NA 137 12/09/2017 0142   NA 140 11/22/2017 0805   K 4.0 12/09/2017 0142   CL 106 12/09/2017 0142   CO2 21 (L) 12/09/2017 0142   GLUCOSE 117 (H) 12/09/2017 0142   BUN 16 12/09/2017 0142   BUN 12 11/22/2017 0805   CREATININE 1.15 12/09/2017 0142   CREATININE 0.99 11/05/2016 0947   CALCIUM 9.0 12/09/2017 0142   PROT 6.7 12/09/2017 0142   PROT 6.1 11/08/2017 0924   ALBUMIN 4.3 12/09/2017 0142   ALBUMIN 4.4 11/08/2017 0924   AST 21  12/09/2017 0142   ALT 36 12/09/2017 0142   ALKPHOS 45 12/09/2017 0142   BILITOT 1.0 12/09/2017 0142   BILITOT <0.2 11/08/2017 0924   GFRNONAA >60 12/09/2017 0142   GFRAA >60 12/09/2017 0142   Lipase     Component Value Date/Time   LIPASE 30 12/09/2017 0142       Studies/Results: Ct Abdomen Pelvis W Contrast  Result Date: 12/09/2017 CLINICAL DATA:  Abdominal pain.  LEFT lower quadrant pain. EXAM: CT ABDOMEN AND PELVIS WITH CONTRAST TECHNIQUE: Multidetector CT imaging of the abdomen and pelvis was performed using the standard protocol following bolus administration of intravenous contrast. CONTRAST:  100mL ISOVUE-300 IOPAMIDOL (ISOVUE-300) INJECTION 61% COMPARISON:  None. FINDINGS: Lower chest: No acute abnormality. RIGHT infrahilar air cyst, incompletely evaluated. Hepatobiliary: No focal liver abnormality is seen. No gallstones, gallbladder wall thickening, or biliary dilatation. Pancreas: Unremarkable. No pancreatic ductal dilatation or surrounding inflammatory changes. Spleen: Normal in size without focal abnormality. Adrenals/Urinary Tract: Adrenal glands are unremarkable. Kidneys are normal, without renal calculi, focal lesion, or hydronephrosis. Bladder is unremarkable. Stomach/Bowel: Normal stomach, small bowel, and appendix. Extensive colonic diverticulosis. Colonic diverticulitis affecting the descending colon and sigmoid regions, with pericolonic stranding and a small amount of poorly contained fluid adjacent to the peritoneal reflection extending along the anterior margin  of the psoas within the retroperitoneum, consistent with phlegmon. Tiny microperforation is likely, see series 6, image 81. Vascular/Lymphatic: Aortic atherosclerosis. No enlarged abdominal or pelvic lymph nodes. Reproductive: Prostate is unremarkable. Other: BILATERAL fat containing inguinal hernias. No abdominopelvic ascites. Musculoskeletal: No acute or significant osseous findings. IMPRESSION: Descending colon and  sigmoid colon diverticulitis, with pericolonic phlegmon, likely small microperforation, but no free intra-abdominal air. Aortic Atherosclerosis (ICD10-I70.0). Electronically Signed   By: Elsie Stain M.D.   On: 12/09/2017 09:35    Anti-infectives: Anti-infectives (From admission, onward)   Start     Dose/Rate Route Frequency Ordered Stop   12/09/17 1830  metroNIDAZOLE (FLAGYL) IVPB 500 mg     500 mg 100 mL/hr over 60 Minutes Intravenous Every 8 hours 12/09/17 1110     12/09/17 1200  cefTRIAXone (ROCEPHIN) 2 g in sodium chloride 0.9 % 100 mL IVPB     2 g 200 mL/hr over 30 Minutes Intravenous Every 24 hours 12/09/17 1117     12/09/17 1100  Ampicillin-Sulbactam (UNASYN) 3 g in sodium chloride 0.9 % 100 mL IVPB  Status:  Discontinued     3 g 200 mL/hr over 30 Minutes Intravenous Every 6 hours 12/09/17 1050 12/09/17 1108   12/09/17 1030  ciprofloxacin (CIPRO) IVPB 400 mg     400 mg 200 mL/hr over 60 Minutes Intravenous  Once 12/09/17 1016 12/09/17 1200   12/09/17 1030  metroNIDAZOLE (FLAGYL) IVPB 500 mg     500 mg 100 mL/hr over 60 Minutes Intravenous  Once 12/09/17 1016 12/09/17 1327       Assessment/Plan HTN HLD GERD CAD H/o NSTEMI 2012 s/p cutting balloon angioplasty  Acute diverticulitis with microperforation - 1st bout of diverticulitis requiring treatment - CT scan shows descending colon and sigmoid colon diverticulitis with pericolonic phlegmon, likely small microperforation, but no free intra-abdominal air.  - Last colonoscopy was 2012 by Dr. Elnoria Howard; per patient it showed "pockets" but he does not know where in the colon they were located - If patient improves would recommend colonoscopy in 6-8 weeks  ID - cipro/flagyl x1 in ED 4/4, rocephin/flagyl 4/4>>day#2 VTE - SCDs, lovenox FEN - IVF, clear liquids Foley - none  Plan - Pain improving, WBC WNL, afebrile. Advance to clear liquids. Continue IV abx rocephin/flagyl.    LOS: 1 day    Franne Forts , [facility] Surgery 12/10/2017, 8:25 AM Pager: [phone] Consults: [phone] Mon-Fri 7:00 am-4:30 pm Sat-Sun 7:00 am-11:30 am

## 2017-12-10 NOTE — Progress Notes (Signed)
  PROGRESS NOTE  Robert Dyer JYN:[id number] DOB: 07/22/1961 DOA: 12/09/2017 PCP: Patient, No Pcp Per  Brief Narrative: 57 year old man PMH coronary artery disease with cutting balloon PTCA, essential hypertension, dyslipidemia, presented with bloating and constipation.  CT showed a sigmoid diverticulitis with phlegmon related to small microperforation but no free air.  Started on antibiotics admitted for surgical evaluation and further treatment.  Assessment/Plan Acute sigmoid diverticulitis with phlegmon, microperforation --Improving.  Continue empiric antibiotics, analgesics, antiemetics as needed --Further recommendations including diet, per surgery.  Surgery has advanced to clear liquids.  Coronary artery disease --Asymptomatic.  Continue aspirin, beta-blocker, statin  Essential hypertension --Continue beta-blocker.  ACE inhibitor on hold  GERD --Continue PPI  Aortic atherosclerosis --Statin  Body mass index is 35.36 kg/m.   DVT prophylaxis: Enoxaparin Code Status: Full code Family Communication: wife at bedside Disposition Plan: home    Brendia Sacksaniel Mckenzye Cutright, MD  [facility] Direct contact: [phone] --Via amion app OR  --[email or web address]; password [email or web address]  7PM-7AM contact night coverage as above 12/10/2017, 6:15 PM  LOS: 1 day   Consultants:  General surgery  Procedures:    Antimicrobials:  Ceftriaxone 4/4 >>  Metronidazole 4/4 >>  Interval history/Subjective: Feels better.  No significant pain at the moment.  No vomiting.  Objective: Vitals:  Vitals:   12/09/17 2058 12/10/17 0437  BP: 111/73 114/66  Pulse: 75 78  Resp: 17 17  Temp: 99.1 F (37.3 C) 98.8 F (37.1 C)  SpO2: 95% 97%    Exam:  Constitutional:  . Appears calm and comfortable lying in bed Eyes:  . pupils and irises appear normal . Normal lids ENMT:  . grossly normal hearing  Respiratory:  . CTA bilaterally, no w/r/r.  . Respiratory effort normal Cardiovascular:   . RRR, no m/r/g . No LE extremity edema   Abdomen:  . Soft, nontender, nondistended Psychiatric:  . Mental status o Mood, affect appropriate . judgement and insight appear normal    I have personally reviewed the following:   Labs:  Basic metabolic panel unremarkable.  CBC unremarkable.  Leukocytosis has resolved.  Scheduled Meds: . aspirin EC  81 mg Oral Daily  . enoxaparin (LOVENOX) injection  40 mg Subcutaneous Q24H  . metoprolol tartrate  12.5 mg Oral BID  . pantoprazole  40 mg Oral Daily  . sodium chloride flush  3 mL Intravenous Q12H   Continuous Infusions: . cefTRIAXone (ROCEPHIN)  IV Stopped (12/10/17 1245)  . metronidazole Stopped (12/10/17 1054)    Principal Problem:   Acute diverticulitis Active Problems:   Coronary artery disease   Hypertension   Aortic atherosclerosis (HCC)   LOS: 1 day

## 2017-12-11 LAB — BASIC METABOLIC PANEL
ANION GAP: 11 (ref 5–15)
BUN: 9 mg/dL (ref 6–20)
CO2: 24 mmol/L (ref 22–32)
Calcium: 9 mg/dL (ref 8.9–10.3)
Chloride: 105 mmol/L (ref 101–111)
Creatinine, Ser: 1.02 mg/dL (ref 0.61–1.24)
GFR calc non Af Amer: >60 mL/min (ref >60)
Glucose, Bld: 109 mg/dL — ABNORMAL HIGH (ref 65–99)
Potassium: 4.2 mmol/L (ref 3.5–5.1)
Sodium: 140 mmol/L (ref 135–145)

## 2017-12-11 LAB — CBC
HEMATOCRIT: 42.1 % (ref 39.0–52.0)
Hemoglobin: 13.8 g/dL (ref 13.0–17.0)
MCH: 29 pg (ref 26.0–34.0)
MCHC: 32.8 g/dL (ref 30.0–36.0)
MCV: 88.4 fL (ref 78.0–100.0)
Platelets: 191 10*3/uL (ref 150–400)
RBC: 4.76 MIL/uL (ref 4.22–5.81)
RDW: 13.1 % (ref 11.5–15.5)
WBC: 5.8 10*3/uL (ref 4.0–10.5)

## 2017-12-11 MED ORDER — LISINOPRIL 10 MG PO TABS
10.0000 mg | ORAL_TABLET | Freq: Every day | ORAL | Status: DC
  Administered 2017-12-11 – 2017-12-12 (×2): 10 mg via ORAL
  Filled 2017-12-11 (×2): qty 1

## 2017-12-11 NOTE — Progress Notes (Signed)
Subjective: Alert and stable.  Tolerating full liquid diet.  Had his first stool, a loose stool, this morning. Still has mild LLQ pain.  Improving every day. No labs today  Objective: Vital signs in last 24 hours: Temp:  [97.7 F (36.5 C)-98.3 F (36.8 C)] 97.7 F (36.5 C) (04/06 0515) Pulse Rate:  [69-75] 69 (04/06 0515) Resp:  [20] 20 (04/06 0515) BP: (128-136)/(82-93) 136/93 (04/06 0515) SpO2:  [96 %-98 %] 98 % (04/06 0515) Last BM Date: 12/09/17  Intake/Output from previous day: 04/05 0701 - 04/06 0700 In: 100 [IV Piggyback:100] Out: 2450 [Urine:2450] Intake/Output this shift: Total I/O In: 100 [IV Piggyback:100] Out: 800 [Urine:800]   PE: Gen:  Alert, NAD, pleasant HEENT: EOM's intact, pupils equal and round Card:  RRR, no M/G/R heard Pulm:  CTAB, no W/R/R, effort normal Abd: obese, soft, mildTTP LLQ without rebound or guarding, no mass.  +BS, no HSM, no hernia Ext:  No erythema, edema, or tenderness BUE/BLE  Psych: A&Ox3  Skin: no rashes noted, warm and dry     Lab Results:  Recent Labs    12/09/17 0142 12/10/17 0635  WBC 12.7* 8.7  HGB 15.4 13.3  HCT 44.9 40.2  PLT 197 172   BMET Recent Labs    12/09/17 0142 12/10/17 0635  NA 137 137  K 4.0 3.9  CL 106 103  CO2 21* 23  GLUCOSE 117* 87  BUN 16 10  CREATININE 1.15 1.06  CALCIUM 9.0 8.4*   PT/INR No results for input(s): LABPROT, INR in the last 72 hours. ABG No results for input(s): PHART, HCO3 in the last 72 hours.  Invalid input(s): PCO2, PO2  Studies/Results: Ct Abdomen Pelvis W Contrast  Result Date: 12/09/2017 CLINICAL DATA:  Abdominal pain.  LEFT lower quadrant pain. EXAM: CT ABDOMEN AND PELVIS WITH CONTRAST TECHNIQUE: Multidetector CT imaging of the abdomen and pelvis was performed using the standard protocol following bolus administration of intravenous contrast. CONTRAST:  100mL ISOVUE-300 IOPAMIDOL (ISOVUE-300) INJECTION 61% COMPARISON:  None. FINDINGS: Lower chest: No acute  abnormality. RIGHT infrahilar air cyst, incompletely evaluated. Hepatobiliary: No focal liver abnormality is seen. No gallstones, gallbladder wall thickening, or biliary dilatation. Pancreas: Unremarkable. No pancreatic ductal dilatation or surrounding inflammatory changes. Spleen: Normal in size without focal abnormality. Adrenals/Urinary Tract: Adrenal glands are unremarkable. Kidneys are normal, without renal calculi, focal lesion, or hydronephrosis. Bladder is unremarkable. Stomach/Bowel: Normal stomach, small bowel, and appendix. Extensive colonic diverticulosis. Colonic diverticulitis affecting the descending colon and sigmoid regions, with pericolonic stranding and a small amount of poorly contained fluid adjacent to the peritoneal reflection extending along the anterior margin of the psoas within the retroperitoneum, consistent with phlegmon. Tiny microperforation is likely, see series 6, image 81. Vascular/Lymphatic: Aortic atherosclerosis. No enlarged abdominal or pelvic lymph nodes. Reproductive: Prostate is unremarkable. Other: BILATERAL fat containing inguinal hernias. No abdominopelvic ascites. Musculoskeletal: No acute or significant osseous findings. IMPRESSION: Descending colon and sigmoid colon diverticulitis, with pericolonic phlegmon, likely small microperforation, but no free intra-abdominal air. Aortic Atherosclerosis (ICD10-I70.0). Electronically Signed   By: Elsie StainJohn T Curnes M.D.   On: 12/09/2017 09:35    Anti-infectives: Anti-infectives (From admission, onward)   Start     Dose/Rate Route Frequency Ordered Stop   12/09/17 1830  metroNIDAZOLE (FLAGYL) IVPB 500 mg     500 mg 100 mL/hr over 60 Minutes Intravenous Every 8 hours 12/09/17 1110     12/09/17 1200  cefTRIAXone (ROCEPHIN) 2 g in sodium chloride 0.9 % 100 mL IVPB  2 g 200 mL/hr over 30 Minutes Intravenous Every 24 hours 12/09/17 1117     12/09/17 1100  Ampicillin-Sulbactam (UNASYN) 3 g in sodium chloride 0.9 % 100 mL IVPB   Status:  Discontinued     3 g 200 mL/hr over 30 Minutes Intravenous Every 6 hours 12/09/17 1050 12/09/17 1108   12/09/17 1030  ciprofloxacin (CIPRO) IVPB 400 mg     400 mg 200 mL/hr over 60 Minutes Intravenous  Once 12/09/17 1016 12/09/17 1200   12/09/17 1030  metroNIDAZOLE (FLAGYL) IVPB 500 mg     500 mg 100 mL/hr over 60 Minutes Intravenous  Once 12/09/17 1016 12/09/17 1327      Assessment/Plan:  HTN HLD GERD CAD H/o NSTEMI 2012 s/p cutting balloon angioplasty  Acute diverticulitis with microperforation - 1st bout of diverticulitis requiring treatment -Improving. - CT scan shows descending colon and sigmoid colon diverticulitis with pericolonic phlegmon, likely small microperforation, but no free intra-abdominal air. - Last colonoscopy was 2012 by Dr. Elnoria Howard; per patient it showed "pockets" but he does not know where in the colon they were located - If patient improves would recommend colonoscopy in 6-8 weeks  ID -cipro/flagyl x1 in ED 4/4,rocephin/flagyl4/4>>day#2 VTE -SCDs, lovenox FEN -IVF, clear liquids Foley -none  Plan -advance to soft diet.  Possible discharge home tomorrow.  Colonoscopy with Dr. Elnoria Howard in 6-8 weeks.. Continue IV abx rocephin/flagyl.  Continue oral antibiotics at home if discharged tomorrow.      LOS: 2 days    Robert Dyer 12/11/2017

## 2017-12-11 NOTE — Progress Notes (Signed)
Patient ID: Robert Dyer, male   DOB: 08-20-1961, 57 y.o.   MRN: [id number]                                                                PROGRESS NOTE                                                                                                                                                                                                             Patient Demographics:    Robert Dyer, is a 57 y.o. male, DOB - 1961/06/29, WUJ:[id number]  Admit date - 12/09/2017   Admitting Physician Lahoma Crocker, MD  Outpatient Primary MD for the patient is Patient, No Pcp Per  LOS - 2  Outpatient Specialists:     Chief Complaint  Patient presents with  . Abdominal Pain       Brief Narrative   57 y.o. male with medical history significant for CAD with prior cardiac catheterization 2012 with cutting balloon PTCA to ramus intermedius, hypertension, dyslipidemia, GERD, diverticulosis, former tobacco abuse.  Patient developed bloating and constipation symptoms 2 days ago.  He took a laxative but within 30 minutes he had explosive diarrhea with undigested food.  Since that time he had not had a bowel movement until after presentation to the ER when he had a nonbloody bowel movement after arrival.  He has subsequently developed severe left lower quadrant abdominal pain with persistent ongoing diarrhea.  In the ER he was normotensive with somewhat suboptimal blood pressure readings in the context of patient with known hypertension, he was afebrile, without sepsis physiology and he had a low-grade leukocytosis.  CT the abdomen and pelvis revealed descending colon and sigmoid diverticulitis with pericolonic phlegmon likely related to small microperforation but no free intra-abdominal air was noted.  Patient has received Cipro and Flagyl by EDP.  Surgery has been consulted but no urgent surgical intervention indicated at this juncture.  ED Course:  Vital Signs: BP 119/78   Pulse 73   Temp 98 F (36.7  C) (Oral)   Resp 16   SpO2 95%  CT abdomen and pelvis: As above Lab data: 137, potassium 4.0, CO2 21, glucose 117, BUN 16, creatinine 1.15, anion gap 10, LFTs normal, white count 12,700 differential not obtained, hemoglobin 15.4, platelets 197,000, urinalysis  unremarkable. Medications and treatments: Tylenol 1 g x1, normal saline bolus times 1 L, GI cocktail x1 dose, Cipro 400 mg IV x1, Flagyl 500 mg IV x1, morphine 8 mg IV x1     Subjective:    Robert Dyer today feeling better.  Denies fever, chills, abd pain, diarrhea, brbpr.   Tolerating liquid diet  No headache, No chest pain, , No new weakness tingling or numbness, No Cough - SOB.    Assessment  & Plan :    Principal Problem:   Acute diverticulitis Active Problems:   Coronary artery disease   Hypertension   Aortic atherosclerosis (HCC)   Acute sigmoid diverticulitis with phlegmon, microperforation --Improving.  Continue empiric antibiotics, analgesics, antiemetics as needed --Further recommendations including diet, per surgery.  Surgery has advanced to soft diet  Coronary artery disease --Asymptomatic.  Continue aspirin, beta-blocker, statin  Essential hypertension --Continue beta-blocker.  ACE inhibitor was on  Hold, will resume today.   GERD --Continue PPI  Aortic atherosclerosis --Statin  Body mass index is 35.36 kg/m.       Code Status : FULL CODE  Family Communication  : w patient  Disposition Plan  : possibly home tomorrow per surgery  Barriers For Discharge :   Consults  :  surgery  Procedures  :     DVT Prophylaxis  :  Lovenox - - SCDs  Lab Results  Component Value Date   PLT 191 12/11/2017    Antibiotics  :     Ceftriaxone 4/4 >>  Metronidazole 4/4 >>    Anti-infectives (From admission, onward)   Start     Dose/Rate Route Frequency Ordered Stop   12/09/17 1830  metroNIDAZOLE (FLAGYL) IVPB 500 mg     500 mg 100 mL/hr over 60 Minutes Intravenous Every 8 hours  12/09/17 1110     12/09/17 1200  cefTRIAXone (ROCEPHIN) 2 g in sodium chloride 0.9 % 100 mL IVPB     2 g 200 mL/hr over 30 Minutes Intravenous Every 24 hours 12/09/17 1117     12/09/17 1100  Ampicillin-Sulbactam (UNASYN) 3 g in sodium chloride 0.9 % 100 mL IVPB  Status:  Discontinued     3 g 200 mL/hr over 30 Minutes Intravenous Every 6 hours 12/09/17 1050 12/09/17 1108   12/09/17 1030  ciprofloxacin (CIPRO) IVPB 400 mg     400 mg 200 mL/hr over 60 Minutes Intravenous  Once 12/09/17 1016 12/09/17 1200   12/09/17 1030  metroNIDAZOLE (FLAGYL) IVPB 500 mg     500 mg 100 mL/hr over 60 Minutes Intravenous  Once 12/09/17 1016 12/09/17 1327        Objective:   Vitals:   12/09/17 2058 12/10/17 0437 12/10/17 2203 12/11/17 0515  BP: 111/73 114/66 128/82 (!) 136/93  Pulse: 75 78 75 69  Resp: 17 17 20 20   Temp: 99.1 F (37.3 C) 98.8 F (37.1 C) 98.3 F (36.8 C) 97.7 F (36.5 C)  TempSrc: Oral Oral Oral Oral  SpO2: 95% 97% 96% 98%  Weight:      Height:        Wt Readings from Last 3 Encounters:  12/09/17 108.6 kg (239 lb 6.7 oz)  11/08/17 110.8 kg (244 lb 3.2 oz)  11/05/16 107 kg (236 lb)     Intake/Output Summary (Last 24 hours) at 12/11/2017 0952 Last data filed at 12/11/2017 0727 Gross per 24 hour  Intake 210 ml  Output 3200 ml  Net -2990 ml     Physical Exam  Awake Alert, Oriented X 3, No new F.N deficits, Normal affect [place].AT,PERRAL Supple Neck,No JVD, No cervical lymphadenopathy appriciated.  Symmetrical Chest wall movement, Good air movement bilaterally, CTAB RRR,No Gallops,Rubs or new Murmurs, No Parasternal Heave +ve B.Sounds, Abd Soft, No tenderness, No organomegaly appriciated, No rebound - guarding or rigidity. No Cyanosis, Clubbing or edema, No new Rash or bruise        Data Review:    CBC Recent Labs  Lab 12/09/17 0142 12/10/17 0635 12/11/17 0654  WBC 12.7* 8.7 5.8  HGB 15.4 13.3 13.8  HCT 44.9 40.2 42.1  PLT 197 172 191  MCV 88.0 89.3 88.4  MCH  30.2 29.6 29.0  MCHC 34.3 33.1 32.8  RDW 13.2 13.1 13.1    Chemistries  Recent Labs  Lab 12/09/17 0142 12/10/17 0635 12/11/17 0654  NA 137 137 140  K 4.0 3.9 4.2  CL 106 103 105  CO2 21* 23 24  GLUCOSE 117* 87 109*  BUN 16 10 9   CREATININE 1.15 1.06 1.02  CALCIUM 9.0 8.4* 9.0  AST 21 14*  --   ALT 36 23  --   ALKPHOS 45 34*  --   BILITOT 1.0 0.8  --    ------------------------------------------------------------------------------------------------------------------ No results for input(s): CHOL, HDL, LDLCALC, TRIG, CHOLHDL, LDLDIRECT in the last 72 hours.  Lab Results  Component Value Date   HGBA1C  02/01/2011    5.5 (NOTE)                                                                       According to the ADA Clinical Practice Recommendations for 2011, when HbA1c is used as a screening test:   >=6.5%   Diagnostic of Diabetes Mellitus           (if abnormal result  is confirmed)  5.7-6.4%   Increased risk of developing Diabetes Mellitus  References:Diagnosis and Classification of Diabetes Mellitus,Diabetes Care,2011,34(Suppl 1):S62-S69 and Standards of Medical Care in         Diabetes - 2011,Diabetes Care,2011,34  (Suppl 1):S11-S61.   ------------------------------------------------------------------------------------------------------------------ No results for input(s): TSH, T4TOTAL, T3FREE, THYROIDAB in the last 72 hours.  Invalid input(s): FREET3 ------------------------------------------------------------------------------------------------------------------ No results for input(s): VITAMINB12, FOLATE, FERRITIN, TIBC, IRON, RETICCTPCT in the last 72 hours.  Coagulation profile No results for input(s): INR, PROTIME in the last 168 hours.  No results for input(s): DDIMER in the last 72 hours.  Cardiac Enzymes No results for input(s): CKMB, TROPONINI, MYOGLOBIN in the last 168 hours.  Invalid input(s):  CK ------------------------------------------------------------------------------------------------------------------ No results found for: BNP  Inpatient Medications  Scheduled Meds: . aspirin EC  81 mg Oral Daily  . enoxaparin (LOVENOX) injection  40 mg Subcutaneous Q24H  . metoprolol tartrate  12.5 mg Oral BID  . pantoprazole  40 mg Oral Daily  . sodium chloride flush  3 mL Intravenous Q12H   Continuous Infusions: . cefTRIAXone (ROCEPHIN)  IV Stopped (12/10/17 1245)  . metronidazole Stopped (12/11/17 0403)   PRN Meds:.acetaminophen **OR** acetaminophen, ALPRAZolam, morphine injection, ondansetron **OR** ondansetron (ZOFRAN) IV, traMADol  Micro Results No results found for this or any previous visit (from the past 240 hour(s)).  Radiology Reports Ct Abdomen Pelvis W Contrast  Result Date: 12/09/2017 CLINICAL DATA:  Abdominal  pain.  LEFT lower quadrant pain. EXAM: CT ABDOMEN AND PELVIS WITH CONTRAST TECHNIQUE: Multidetector CT imaging of the abdomen and pelvis was performed using the standard protocol following bolus administration of intravenous contrast. CONTRAST:  ISOVUE-300 IOPAMIDOL (ISOVUE-300) INJECTION 61% COMPARISON:  None. FINDINGS: Lower chest: No acute abnormality. RIGHT infrahilar air cyst, incompletely evaluated. Hepatobiliary: No focal liver abnormality is seen. No gallstones, gallbladder wall thickening, or biliary dilatation. Pancreas: Unremarkable. No pancreatic ductal dilatation or surrounding inflammatory changes. Spleen: Normal in size without focal abnormality. Adrenals/Urinary Tract: Adrenal glands are unremarkable. Kidneys are normal, without renal calculi, focal lesion, or hydronephrosis. Bladder is unremarkable. Stomach/Bowel: Normal stomach, small bowel, and appendix. Extensive colonic diverticulosis. Colonic diverticulitis affecting the descending colon and sigmoid regions, with pericolonic stranding and a small amount of poorly contained fluid adjacent to  the peritoneal reflection extending along the anterior margin of the psoas within the retroperitoneum, consistent with phlegmon. Tiny microperforation is likely, see series 6, image 81. Vascular/Lymphatic: Aortic atherosclerosis. No enlarged abdominal or pelvic lymph nodes. Reproductive: Prostate is unremarkable. Other: BILATERAL fat containing inguinal hernias. No abdominopelvic ascites. Musculoskeletal: No acute or significant osseous findings. IMPRESSION: Descending colon and sigmoid colon diverticulitis, with pericolonic phlegmon, likely small microperforation, but no free intra-abdominal air. Aortic Atherosclerosis (ICD10-I70.0). Electronically Signed   By: Elsie Stain M.D.   On: 12/09/2017 09:35    Time Spent in minutes  30   Pearson Grippe M.D on 12/11/2017 at 9:52 AM  Between 7am to 7pm - Pager - [phone]  After 7pm go to [email or web address] - password [facility]  [facility] -  Office  [phone]

## 2017-12-12 LAB — CBC
HCT: 41.6 % (ref 39.0–52.0)
Hemoglobin: 13.9 g/dL (ref 13.0–17.0)
MCH: 29.4 pg (ref 26.0–34.0)
MCHC: 33.4 g/dL (ref 30.0–36.0)
MCV: 88.1 fL (ref 78.0–100.0)
PLATELETS: 217 10*3/uL (ref 150–400)
RBC: 4.72 MIL/uL (ref 4.22–5.81)
RDW: 13 % (ref 11.5–15.5)
WBC: 6.1 10*3/uL (ref 4.0–10.5)

## 2017-12-12 LAB — COMPREHENSIVE METABOLIC PANEL
ALT: 31 U/L (ref 17–63)
ANION GAP: 12 (ref 5–15)
AST: 25 U/L (ref 15–41)
Albumin: 3.4 g/dL — ABNORMAL LOW (ref 3.5–5.0)
Alkaline Phosphatase: 35 U/L — ABNORMAL LOW (ref 38–126)
BUN: 12 mg/dL (ref 6–20)
CHLORIDE: 106 mmol/L (ref 101–111)
CO2: 22 mmol/L (ref 22–32)
Calcium: 8.9 mg/dL (ref 8.9–10.3)
Creatinine, Ser: 1.08 mg/dL (ref 0.61–1.24)
Glucose, Bld: 106 mg/dL — ABNORMAL HIGH (ref 65–99)
POTASSIUM: 3.6 mmol/L (ref 3.5–5.1)
Sodium: 140 mmol/L (ref 135–145)
Total Bilirubin: 0.3 mg/dL (ref 0.3–1.2)
Total Protein: 5.7 g/dL — ABNORMAL LOW (ref 6.5–8.1)

## 2017-12-12 MED ORDER — METRONIDAZOLE 500 MG PO TABS: 500.0000 mg | ORAL_TABLET | Freq: Three times a day (TID) | ORAL | 0 refills | Status: AC

## 2017-12-12 MED ORDER — CEFUROXIME AXETIL 500 MG PO TABS: 500.0000 mg | ORAL_TABLET | Freq: Two times a day (BID) | ORAL | 0 refills | Status: AC

## 2017-12-12 MED ORDER — TRAMADOL HCL 50 MG PO TABS: 50.0000 mg | ORAL_TABLET | Freq: Four times a day (QID) | ORAL | 0 refills | Status: DC | PRN

## 2017-12-12 NOTE — Discharge Summary (Signed)
Robert Dyer, is a 57 y.o. male  DOB Feb 01, 1961  MRN [id number].  Admission date:  12/09/2017  Admitting Physician  Lahoma Crocker, MD  Discharge Date:  12/12/2017   Primary MD  Patient, No Pcp Per  Recommendations for primary care physician for things to follow:    Acute sigmoid diverticulitis with phlegmon, microperforation Cont Flagyl 500mg  po tid x 1 week Ceftin 500mg  po bid x 1 week F/u with GI in 6-8 weeks F/u with surgery in 1-2 weeks  Coronary artery disease Asymptomatic. Continue aspirin, beta-blocker, statin  Essential hypertension --Continue beta-blocker. Cont ACE inhibitor  GERD --ContinuePPI  Aortic atherosclerosis --Statin  Body mass index is 35.36 kg/m.       Admission Diagnosis  LLQ abdominal pain [R10.32] Acute diverticulitis [K57.92]   Discharge Diagnosis  LLQ abdominal pain [R10.32] Acute diverticulitis [K57.92]    Principal Problem:   Acute diverticulitis Active Problems:   Coronary artery disease   Hypertension   Aortic atherosclerosis (HCC)      Past Medical History:  Diagnosis Date  . Coronary artery disease   . Diverticulosis   . GERD (gastroesophageal reflux disease)   . Hyperlipidemia   . Hypertension   . MI, acute, non ST segment elevation (HCC)    s/p cutting balloon PTCA to the ramus intermedius  . Tobacco abuse, in remission     Past Surgical History:  Procedure Laterality Date  . CARDIAC CATHETERIZATION  02/03/2011   revealing a total occlusion of the mid first diagonal with a 99% ostial stenosis of the ramus intermedius, and otherwise nonobstructive  disease.  EF was 45% with distal anterolateral akinesis.  The first  diagonal was felt to be the infarct vessel that was felt that it was  likely occluded for some time and was not likely to benefit with  reperfusion.    . COLONOSCOPY  2012   Dr. Elnoria Howard  . CORONARY  ANGIOPLASTY WITH STENT PLACEMENT  02/03/2011   s/p cutting balloon PTCA to the ramus intermediate  . HEMORROIDECTOMY         HPI  from the history and physical done on the day of admission:     57 y.o.malewith medical history significant forCAD with prior cardiac catheterization 2012 with cutting balloon PTCA to ramus intermedius, hypertension, dyslipidemia, GERD, diverticulosis, former tobacco abuse. Patient developed bloating and constipation symptoms 2 days ago. He took a laxative but within 30 minutes he had explosive diarrhea with undigested food. Since that time he had not had a bowel movement until after presentation to the ER when he had a nonbloody bowel movement after arrival. He has subsequently developed severe left lower quadrant abdominal pain with persistent ongoing diarrhea. In the ER he was normotensive with somewhat suboptimal blood pressure readings in the context of patient with known hypertension, he was afebrile, without sepsis physiologyand he had a low-grade leukocytosis. CT the abdomen and pelvis revealed descending colon and sigmoid diverticulitis with pericolonic phlegmon likely related to small microperforation but no free  intra-abdominal air was noted. Patient has received Cipro and Flagyl by EDP. Surgery has been consulted but no urgent surgical intervention indicated at this juncture.  ED Course: Vital Signs:BP 119/78  Pulse 73  Temp 98 F (36.7 C) (Oral)  Resp 16  SpO2 95%  CT abdomen and pelvis:As above Lab data:137, potassium 4.0, CO2 21, glucose 117, BUN 16, creatinine 1.15, anion gap 10, LFTs normal, white count 12,700 differential not obtained, hemoglobin 15.4, platelets 197,000, urinalysis unremarkable. Medications and treatments:Tylenol 1 g x1, normal saline bolus times 1 L, GI cocktail x1 dose, Cipro 400 mg IV x1, Flagyl 500 mg IV x1, morphine 8 mg IV x1       Hospital Course:     Pt was started on  Rocephin 4/4 as well as  Flagyl 4/4 and tolerated the abx.  Pt was seen by surgery due to microperforation with pericolonic phlegmon.  No surgical intervention pursued.  Pt appears to be doing well. No abdominal pain.  No diarrhea, no brbpr.  Pt has been seen by surgery and feels that he can continue on oral abx and be discharged home.    Follow UP  Follow-up Information    Jeani HawkingHung, Patrick, MD. Call.   Specialty:  Gastroenterology Why:  Will need to schedule a colonscopy in about 6-8 weeks, once diverticulitis resolves Contact information: [address], [address] [place] [address] [phone]        Claud KelpIngram, Haywood, MD Follow up in 1 week(s).   Specialty:  General Surgery Contact information: [address] [place] [address] [phone]            Consults obtained - surgery  Discharge Condition: stable  Diet and Activity recommendation: See Discharge Instructions below  Discharge Instructions         Discharge Medications     Allergies as of 12/12/2017      Reactions   Statins Rash      Medication List    TAKE these medications   ALPRAZolam 0.25 MG tablet Commonly known as:  XANAX Take 1 tablet (0.25 mg total) by mouth 3 (three) times daily as needed for anxiety (anxiety).   aspirin 81 MG tablet Take 81 mg by mouth daily.   cefUROXime 500 MG tablet Commonly known as:  CEFTIN Take 1 tablet (500 mg total) by mouth 2 (two) times daily for 10 days.   lisinopril 10 MG tablet Commonly known as:  PRINIVIL,ZESTRIL Take 1 tablet (10 mg total) by mouth daily.   metoprolol tartrate 25 MG tablet Commonly known as:  LOPRESSOR TAKE 1/2 TABLET TWICE A DAY   metroNIDAZOLE 500 MG tablet Commonly known as:  FLAGYL Take 1 tablet (500 mg total) by mouth 3 (three) times daily for 7 days.   nitroGLYCERIN 0.4 MG SL tablet Commonly known as:  NITROSTAT Place 0.4 mg under the tongue every 5 (five) minutes as needed for chest pain.   pantoprazole 40 MG  tablet Commonly known as:  PROTONIX TAKE 1 TABLET (40 MG TOTAL) BY MOUTH DAILY. What changed:  Another medication with the same name was removed. Continue taking this medication, and follow the directions you see here.   rosuvastatin 10 MG tablet Commonly known as:  CRESTOR TAKE 1 TABLET (10 MG TOTAL) BY MOUTH DAILY.   traMADol 50 MG tablet Commonly known as:  ULTRAM Take 1 tablet (50 mg total) by mouth every 6 (six) hours as needed for moderate pain.       Major procedures  and Radiology Reports - PLEASE review detailed and final reports for all details, in brief -       Ct Abdomen Pelvis W Contrast  Result Date: 12/09/2017 CLINICAL DATA:  Abdominal pain.  LEFT lower quadrant pain. EXAM: CT ABDOMEN AND PELVIS WITH CONTRAST TECHNIQUE: Multidetector CT imaging of the abdomen and pelvis was performed using the standard protocol following bolus administration of intravenous contrast. CONTRAST:  ISOVUE-300 IOPAMIDOL (ISOVUE-300) INJECTION 61% COMPARISON:  None. FINDINGS: Lower chest: No acute abnormality. RIGHT infrahilar air cyst, incompletely evaluated. Hepatobiliary: No focal liver abnormality is seen. No gallstones, gallbladder wall thickening, or biliary dilatation. Pancreas: Unremarkable. No pancreatic ductal dilatation or surrounding inflammatory changes. Spleen: Normal in size without focal abnormality. Adrenals/Urinary Tract: Adrenal glands are unremarkable. Kidneys are normal, without renal calculi, focal lesion, or hydronephrosis. Bladder is unremarkable. Stomach/Bowel: Normal stomach, small bowel, and appendix. Extensive colonic diverticulosis. Colonic diverticulitis affecting the descending colon and sigmoid regions, with pericolonic stranding and a small amount of poorly contained fluid adjacent to the peritoneal reflection extending along the anterior margin of the psoas within the retroperitoneum, consistent with phlegmon. Tiny microperforation is likely, see series 6, image  81. Vascular/Lymphatic: Aortic atherosclerosis. No enlarged abdominal or pelvic lymph nodes. Reproductive: Prostate is unremarkable. Other: BILATERAL fat containing inguinal hernias. No abdominopelvic ascites. Musculoskeletal: No acute or significant osseous findings. IMPRESSION: Descending colon and sigmoid colon diverticulitis, with pericolonic phlegmon, likely small microperforation, but no free intra-abdominal air. Aortic Atherosclerosis (ICD10-I70.0). Electronically Signed   By: Elsie Stain M.D.   On: 12/09/2017 09:35    Micro Results     No results found for this or any previous visit (from the past 240 hour(s)).     Today   Subjective    Eugenie Filler today has no abdominal pain,   no headache,no chest pain,no new weakness tingling or numbness, feels much better wants to go home today.   Objective   Blood pressure (!) 143/84, pulse 76, temperature 97.9 F (36.6 C), temperature source Oral, resp. rate 16, height 5\' 9"  (1.753 m), weight 108.6 kg (239 lb 6.7 oz), SpO2 96 %.   Intake/Output Summary (Last 24 hours) at 12/12/2017 0753 Last data filed at 12/12/2017 0616 Gross per 24 hour  Intake 1143 ml  Output 1250 ml  Net -107 ml    Exam Awake Alert, Oriented x 3, No new F.N deficits, Normal affect [place].AT,PERRAL Supple Neck,No JVD, No cervical lymphadenopathy appriciated.  Symmetrical Chest wall movement, Good air movement bilaterally, CTAB RRR,No Gallops,Rubs or new Murmurs, No Parasternal Heave +ve B.Sounds, Abd Soft, Non tender, No organomegaly appriciated, No rebound -guarding or rigidity. No Cyanosis, Clubbing or edema, No new Rash or bruise   Data Review   CBC w Diff:  Lab Results  Component Value Date   WBC 6.1 12/12/2017   HGB 13.9 12/12/2017   HCT 41.6 12/12/2017   PLT 217 12/12/2017    CMP:  Lab Results  Component Value Date   NA 140 12/12/2017   NA 140 11/22/2017   K 3.6 12/12/2017   CL 106 12/12/2017   CO2 22 12/12/2017   BUN 12 12/12/2017    BUN 12 11/22/2017   CREATININE 1.08 12/12/2017   CREATININE 0.99 11/05/2016   PROT 5.7 (L) 12/12/2017   PROT 6.1 11/08/2017   ALBUMIN 3.4 (L) 12/12/2017   ALBUMIN 4.4 11/08/2017   BILITOT 0.3 12/12/2017   BILITOT <0.2 11/08/2017   ALKPHOS 35 (L) 12/12/2017   AST 25 12/12/2017   ALT  31 12/12/2017  .   Total Time in preparing paper work, data evaluation and todays exam - 35 minutes  Pearson Grippe M.D on 12/12/2017 at 7:53 AM  [facility]   Office  [phone]

## 2017-12-12 NOTE — Progress Notes (Signed)
Robert Dyer to be D/C'd  per MD order. Discussed with the patient and all questions fully answered.  VSS, Skin clean, dry and intact without evidence of skin break down, no evidence of skin tears noted.  IV catheter discontinued intact. Site without signs and symptoms of complications. Dressing and pressure applied.  An After Visit Summary was printed and given to the patient. Patient received prescription.  D/c education completed with patient/family including follow up instructions, medication list, d/c activities limitations if indicated, with other d/c instructions as indicated by MD - patient able to verbalize understanding, all questions fully answered.   Patient instructed to return to ED, call 911, or call MD for any changes in condition.   Patient to be escorted via WC, and D/C home via private auto.

## 2017-12-28 ENCOUNTER — Other Ambulatory Visit: Payer: Self-pay | Admitting: Cardiovascular Disease

## 2018-02-15 DIAGNOSIS — J019 Acute sinusitis, unspecified: Secondary | ICD-10-CM | POA: Diagnosis not present

## 2018-05-03 ENCOUNTER — Other Ambulatory Visit: Payer: Self-pay | Admitting: Physician Assistant

## 2018-05-03 NOTE — Telephone Encounter (Signed)
Rx sent to pharmacy   

## 2018-10-27 ENCOUNTER — Other Ambulatory Visit: Payer: Self-pay | Admitting: Cardiovascular Disease

## 2018-12-21 ENCOUNTER — Telehealth: Payer: Self-pay

## 2018-12-21 NOTE — Telephone Encounter (Signed)
Left message to call back  

## 2018-12-22 ENCOUNTER — Other Ambulatory Visit: Payer: Self-pay | Admitting: Cardiovascular Disease

## 2018-12-26 ENCOUNTER — Ambulatory Visit: Payer: BLUE CROSS/BLUE SHIELD | Admitting: Cardiovascular Disease

## 2019-01-10 DIAGNOSIS — I10 Essential (primary) hypertension: Secondary | ICD-10-CM | POA: Diagnosis not present

## 2019-01-10 DIAGNOSIS — I25111 Atherosclerotic heart disease of native coronary artery with angina pectoris with documented spasm: Secondary | ICD-10-CM | POA: Diagnosis not present

## 2019-01-10 DIAGNOSIS — Z0000 Encounter for general adult medical examination without abnormal findings: Secondary | ICD-10-CM | POA: Diagnosis not present

## 2019-01-10 DIAGNOSIS — Z125 Encounter for screening for malignant neoplasm of prostate: Secondary | ICD-10-CM | POA: Diagnosis not present

## 2019-01-10 DIAGNOSIS — E7800 Pure hypercholesterolemia, unspecified: Secondary | ICD-10-CM | POA: Diagnosis not present

## 2019-01-10 DIAGNOSIS — I252 Old myocardial infarction: Secondary | ICD-10-CM | POA: Diagnosis not present

## 2019-01-20 ENCOUNTER — Other Ambulatory Visit: Payer: Self-pay | Admitting: Cardiovascular Disease

## 2019-01-20 NOTE — Telephone Encounter (Signed)
crestor 10 mg refilled.

## 2019-01-23 ENCOUNTER — Other Ambulatory Visit: Payer: Self-pay | Admitting: Cardiovascular Disease

## 2019-01-23 NOTE — Telephone Encounter (Signed)
Lisinopril and pantoprazole.

## 2019-04-15 ENCOUNTER — Other Ambulatory Visit: Payer: Self-pay | Admitting: Cardiovascular Disease

## 2019-04-24 ENCOUNTER — Encounter: Payer: Self-pay | Admitting: Physician Assistant

## 2019-04-24 ENCOUNTER — Other Ambulatory Visit: Payer: Self-pay

## 2019-04-24 ENCOUNTER — Ambulatory Visit: Payer: BC Managed Care – PPO | Admitting: Physician Assistant

## 2019-04-24 VITALS — BP 120/70 | HR 75 | Ht 69.0 in | Wt 239.0 lb

## 2019-04-24 DIAGNOSIS — E785 Hyperlipidemia, unspecified: Secondary | ICD-10-CM

## 2019-04-24 DIAGNOSIS — I10 Essential (primary) hypertension: Secondary | ICD-10-CM | POA: Diagnosis not present

## 2019-04-24 DIAGNOSIS — I2510 Atherosclerotic heart disease of native coronary artery without angina pectoris: Secondary | ICD-10-CM | POA: Diagnosis not present

## 2019-04-24 MED ORDER — NITROGLYCERIN 0.4 MG SL SUBL: 0.4000 mg | SUBLINGUAL_TABLET | SUBLINGUAL | 3 refills | Status: DC | PRN

## 2019-04-24 MED ORDER — LISINOPRIL 10 MG PO TABS: 10.0000 mg | ORAL_TABLET | Freq: Every day | ORAL | 3 refills | Status: DC

## 2019-04-24 NOTE — Progress Notes (Signed)
Cardiology Office Note    Date:  04/24/2019   ID:  Robert Dyer, DOB 1961-05-06, MRN [id number]  PCP:  Patient, No Pcp Per  Cardiologist:  Dr. Sallyanne Kuster  Chief Complaint  Patient presents with  . Follow-up    seen for Dr. Sallyanne Kuster. Annual followup, no issues    History of Present Illness:  Robert Dyer is a 58 y.o. male with PMH of HTN, HLD, GERD, tobacco use, obesity and CAD s/p PTCA of RI in 01/2011. Last echocardiogram obtained on 08/02/2013 showed EF 45 to 50%.  Patient was last seen by Rosaria Ferries, PA-C on 11/08/2017, lisinopril 10 mg daily was added for blood pressure.  Patient presents today for cardiology office visit.  He denies any chest pain or shortness of breath.  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  He denies any lower extremity edema, orthopnea or PND.  His blood pressure is very well controlled.  Previous lab work that was sent to [place] in May 2020 demonstrated stable renal function and electrolyte and CBC.  His HDL is chronically low.  LDL borderline high when compared to the previous level.  Triglyceride is also borderline high as well.  He says he did get a gym membership right before the pandemic, however has not been able to use it.  I suspect his lipid panel is going to improve with increasing activity.  At this time, I recommended continue on the current dose of the Crestor.  EKG showed no acute changes.  We will follow-up with the patient in 1 year.  Past Medical History:  Diagnosis Date  . Coronary artery disease   . Diverticulosis   . GERD (gastroesophageal reflux disease)   . Hyperlipidemia   . Hypertension   . MI, acute, non ST segment elevation (HCC)    s/p cutting balloon PTCA to the ramus intermedius  . Tobacco abuse, in remission     Past Surgical History:  Procedure Laterality Date  . CARDIAC CATHETERIZATION  02/03/2011   revealing a total occlusion of the mid first diagonal with a 99% ostial stenosis of the  ramus intermedius, and otherwise nonobstructive  disease.  EF was 45% with distal anterolateral akinesis.  The first  diagonal was felt to be the infarct vessel that was felt that it was  likely occluded for some time and was not likely to benefit with  reperfusion.    . COLONOSCOPY  2012   Dr. Benson [place]  . CORONARY ANGIOPLASTY WITH STENT PLACEMENT  02/03/2011   s/p cutting balloon PTCA to the ramus intermediate  . HEMORROIDECTOMY      Current Medications: Outpatient Medications Prior to Visit  Medication Sig Dispense Refill  . ALPRAZolam (XANAX) 0.25 MG tablet Take 0.25 mg by mouth 2 (two) times daily as needed for anxiety.    Marland Kitchen aspirin 81 MG tablet Take 81 mg by mouth daily.      . metoprolol tartrate (LOPRESSOR) 25 MG tablet TAKE 1/2 TABLET TWICE A DAY 90 tablet 3  . pantoprazole (PROTONIX) 40 MG tablet Take 1 tablet (40 mg total) by mouth daily. KEEP  OV 30 tablet 0  . rosuvastatin (CRESTOR) 10 MG tablet TAKE 1 TABLET BY MOUTH EVERY DAY  KEEP OV 90 tablet 0  . lisinopril (ZESTRIL) 10 MG tablet Take 1 tablet (10 mg total) by mouth daily. KEEP OV 30 tablet 0  . nitroGLYCERIN (NITROSTAT) 0.4 MG SL tablet Place 0.4 mg under the tongue every 5 (five)  minutes as needed for chest pain.    Marland Kitchen. ALPRAZolam (XANAX) 0.25 MG tablet Take 1 tablet (0.25 mg total) by mouth 3 (three) times daily as needed for anxiety (anxiety). (Patient not taking: Reported on 04/24/2019) 45 tablet 0  . traMADol (ULTRAM) 50 MG tablet Take 1 tablet (50 mg total) by mouth every 6 (six) hours as needed for moderate pain. (Patient not taking: Reported on 04/24/2019) 20 tablet 0   No facility-administered medications prior to visit.      Allergies:   Statins   Social History   Socioeconomic History  . Marital status: Married    Spouse name: Not on file  . Number of children: Not on file  . Years of education: Not on file  . Highest education level: Not on file  Occupational History  . Not on file  Social Needs  . Financial  resource strain: Not on file  . Food insecurity    Worry: Not on file    Inability: Not on file  . Transportation needs    Medical: Not on file    Non-medical: Not on file  Tobacco Use  . Smoking status: Former Smoker    Types: Cigarettes    Quit date: 09/07/2016    Years since quitting: 2.6  . Smokeless tobacco: Former Engineer, waterUser  Substance and Sexual Activity  . Alcohol use: Yes  . Drug use: Yes    Types: Marijuana  . Sexual activity: Not on file  Lifestyle  . Physical activity    Days per week: Not on file    Minutes per session: Not on file  . Stress: Not on file  Relationships  . Social Musicianconnections    Talks on phone: Not on file    Gets together: Not on file    Attends religious service: Not on file    Active member of club or organization: Not on file    Attends meetings of clubs or organizations: Not on file    Relationship status: Not on file  Other Topics Concern  . Not on file  Social History Narrative  . Not on file     Family History:  The patient's family history includes Heart attack in his mother; Kidney disease in his father; Liver disease in his father.   ROS:   Please see the history of present illness.    ROS All other systems reviewed and are negative.   PHYSICAL EXAM:   VS:  BP 120/70   Pulse 75   Ht 5\' 9"  (1.753 m)   Wt 239 lb (108.4 kg)   BMI 35.29 kg/m    GEN: Well nourished, well developed, in no acute distress  HEENT: normal  Neck: no JVD, carotid bruits, or masses Cardiac: RRR; no murmurs, rubs, or gallops,no edema  Respiratory:  clear to auscultation bilaterally, normal work of breathing GI: soft, nontender, nondistended, + BS MS: no deformity or atrophy  Skin: warm and dry, no rash Neuro:  Alert and Oriented x 3, Strength and sensation are intact Psych: euthymic mood, full affect  Wt Readings from Last 3 Encounters:  04/24/19 239 lb (108.4 kg)  12/09/17 239 lb 6.7 oz (108.6 kg)  11/08/17 244 lb 3.2 oz (110.8 kg)       Studies/Labs Reviewed:   EKG:  EKG is ordered today.  The ekg ordered today demonstrates normal sinus rhythm without significant ST-T wave changes  Recent Labs: No results found for requested labs within last 8760 hours.   Lipid Panel  Component Value Date/Time   CHOL 110 11/08/2017 0924   TRIG 87 11/08/2017 0924   HDL 26 (L) 11/08/2017 0924   CHOLHDL 4.2 11/08/2017 0924   CHOLHDL 4.6 11/05/2016 0947   VLDL 28 11/05/2016 0947   LDLCALC 67 11/08/2017 0924    Additional studies/ records that were reviewed today include:   Echo 08/02/2013 Study Conclusions   - Left ventricle: Distal septal and apical hypokinesis  Cannot r/o calcified mural thrombus at apex Consider  contrast study or MRI if clinically indicated The cavity  size was mildly dilated. Wall thickness was normal.  Systolic function was mildly reduced. The estimated  ejection fraction was in the range of 45% to 50%.  - Atrial septum: No defect or patent foramen ovale was  identified.     ASSESSMENT:    1. Coronary artery disease involving native coronary artery of native heart without angina pectoris   2. Essential hypertension   3. Hyperlipidemia LDL goal <70      PLAN:  In order of problems listed above:  1. CAD: Denies any recent anginal symptoms.  Continue aspirin and statin.  2. Hypertension: Blood pressure very well controlled on metoprolol and lisinopril.  3. Hyperlipidemia: Continue current dose of Crestor.  Recent lipid panel obtained earlier this year demonstrated chronically low HDL, borderline high LDL, triglyceride was also borderline high as well when compared to last year's lab results.  I suspect this is likely related to inactivity during the pandemic.  I recommended continue on the current dose and reevaluate in the future.      Medication Adjustments/Labs and Tests Ordered: Current medicines are reviewed at length with the patient today.  Concerns regarding medicines are  outlined above.  Medication changes, Labs and Tests ordered today are listed in the Patient Instructions below. Patient Instructions  Medication Instructions:  Your physician recommends that you continue on your current medications as directed. Please refer to the Current Medication list given to you today.  If you need a refill on your cardiac medications before your next appointment, please call your pharmacy.   Lab work: NONE ordered at this time of appointment   If you have labs (blood work) drawn today and your tests are completely normal, you will receive your results only by: Marland Kitchen. MyChart Message (if you have MyChart) OR . A paper copy in the mail If you have any lab test that is abnormal or we need to change your treatment, we will call you to review the results.  Testing/Procedures: NONE ordered at this time of appointment   Follow-Up: At [facility], you and your health needs are our priority.  As part of our continuing mission to provide you with exceptional heart care, we have created designated Provider Care Teams.  These Care Teams include your primary Cardiologist (physician) and Advanced Practice Providers (APPs -  Physician Assistants and Nurse Practitioners) who all work together to provide you with the care you need, when you need it. . You will need a follow up appointment in 12 months (August 2021). Please call our office in June 2021 to schedule this appointment with Thurmon FairMihai Croitoru, MD  Any Other Special Instructions Will Be Listed Below (If Applicable).       Ramond DialSigned, Chrislyn Seedorf, [place]  04/24/2019 8:28 AM    [facility] [address], [place], [place]  [address] Phone: [phone]; Fax: [phone]

## 2019-04-24 NOTE — Patient Instructions (Signed)
Medication Instructions:  Your physician recommends that you continue on your current medications as directed. Please refer to the Current Medication list given to you today.  If you need a refill on your cardiac medications before your next appointment, please call your pharmacy.   Lab work: NONE ordered at this time of appointment   If you have labs (blood work) drawn today and your tests are completely normal, you will receive your results only by: Marland Kitchen MyChart Message (if you have MyChart) OR . A paper copy in the mail If you have any lab test that is abnormal or we need to change your treatment, we will call you to review the results.  Testing/Procedures: NONE ordered at this time of appointment   Follow-Up: At [facility], you and your health needs are our priority.  As part of our continuing mission to provide you with exceptional heart care, we have created designated Provider Care Teams.  These Care Teams include your primary Cardiologist (physician) and Advanced Practice Providers (APPs -  Physician Assistants and Nurse Practitioners) who all work together to provide you with the care you need, when you need it. . You will need a follow up appointment in 12 months (August 2021). Please call our office in June 2021 to schedule this appointment with Robert Klein, MD  Any Other Special Instructions Will Be Listed Below (If Applicable).

## 2019-05-14 ENCOUNTER — Other Ambulatory Visit: Payer: Self-pay | Admitting: Cardiovascular Disease

## 2019-06-14 ENCOUNTER — Other Ambulatory Visit: Payer: Self-pay | Admitting: Physician Assistant

## 2019-07-09 ENCOUNTER — Other Ambulatory Visit: Payer: Self-pay | Admitting: Physician Assistant

## 2019-07-13 ENCOUNTER — Other Ambulatory Visit: Payer: Self-pay | Admitting: Physician Assistant

## 2019-08-07 ENCOUNTER — Other Ambulatory Visit: Payer: Self-pay | Admitting: Physician Assistant

## 2019-08-10 ENCOUNTER — Other Ambulatory Visit: Payer: Self-pay | Admitting: Cardiovascular Disease

## 2019-08-10 NOTE — Telephone Encounter (Signed)
New Message      *STAT* If patient is at the pharmacy, call can be transferred to refill team.   1. Which medications need to be refilled? (please list name of each medication and dose if known) pantoprazole (PROTONIX) 40 MG tablet  2. Which pharmacy/location (including street and city if local pharmacy) is medication to be sent to? [id number] - [place], [place] - [address] AT [place]  3. Do they need a 30 day or 90 day supply? 90  Day

## 2019-09-03 ENCOUNTER — Other Ambulatory Visit: Payer: Self-pay | Admitting: Physician Assistant

## 2019-09-28 ENCOUNTER — Other Ambulatory Visit: Payer: Self-pay | Admitting: Cardiovascular Disease

## 2019-09-28 ENCOUNTER — Other Ambulatory Visit: Payer: Self-pay | Admitting: Physician Assistant

## 2019-09-29 MED ORDER — METOPROLOL TARTRATE 25 MG PO TABS: 12.5000 mg | ORAL_TABLET | Freq: Two times a day (BID) | ORAL | 3 refills | Status: DC

## 2019-11-10 IMAGING — CT CT ABD-PELV W/ CM
2 of 5 series · 17 of 46 positions shown, 19 images · IV contrast (iopamidol)
Comparison: None.

CLINICAL DATA: Abdominal pain.  LEFT lower quadrant pain.

EXAM:
CT ABDOMEN AND PELVIS WITH CONTRAST
TECHNIQUE: Multidetector CT imaging of the abdomen and pelvis was performed
using the standard protocol following bolus administration of
intravenous contrast.
CONTRAST:  100mL CC4BQP-UII IOPAMIDOL (CC4BQP-UII) INJECTION 61%

[Series 3: a/p w/ 5mm · axial · 0.95mm/px · z∈[+707,+1157]mm · 14 of 102 slices shown, 16 images]
[im 6/102  soft-tissue]
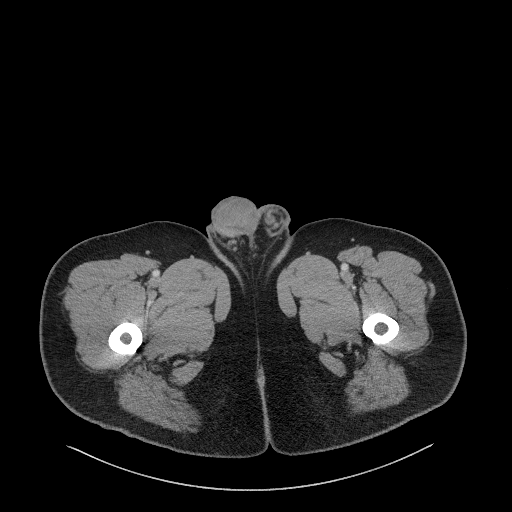
[im 6/102  bone]
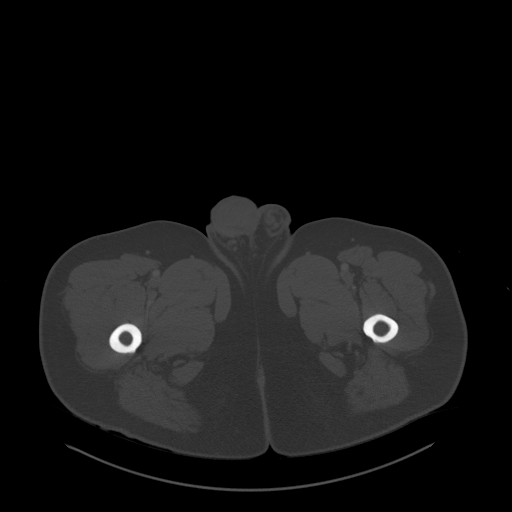
[im 16/102  soft-tissue]
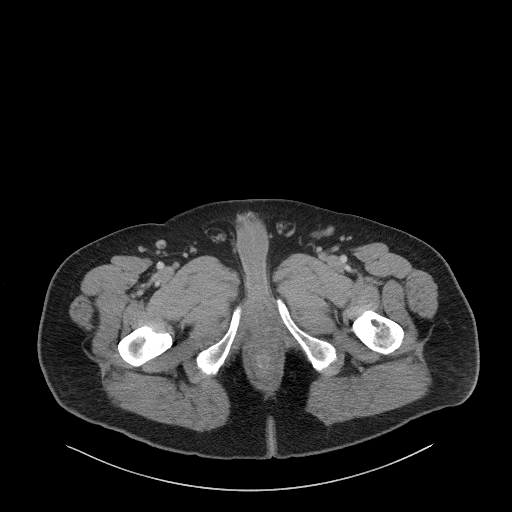
[im 21/102  soft-tissue]
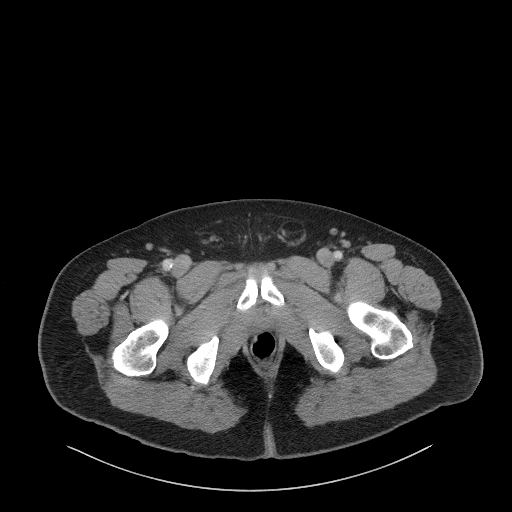
[im 26/102  soft-tissue]
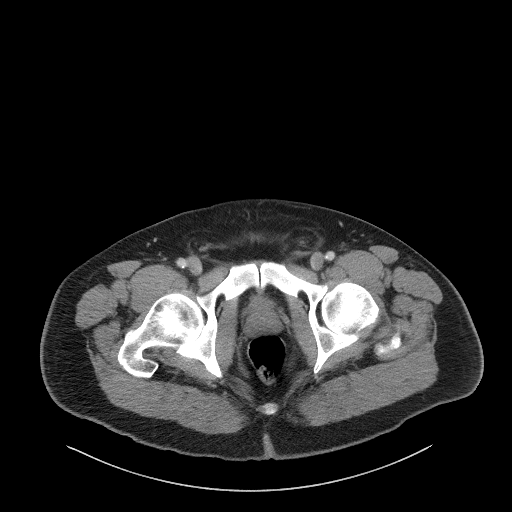
[im 36/102  soft-tissue]
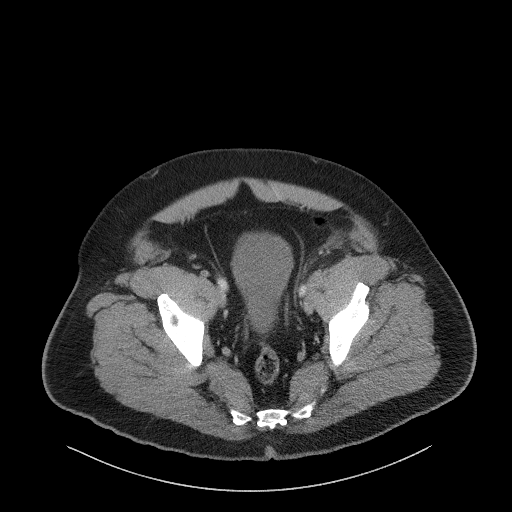
[im 41/102  soft-tissue]
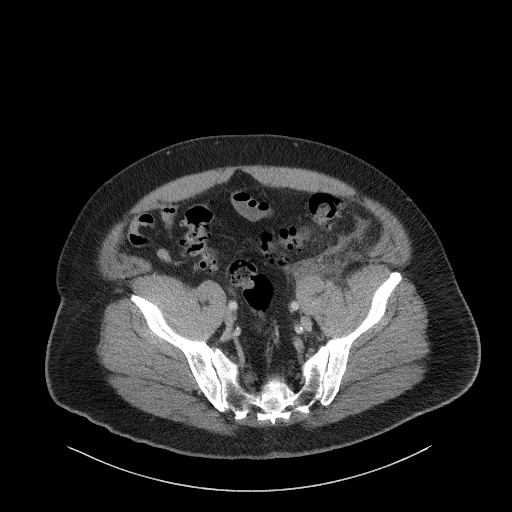
[im 46/102  soft-tissue]
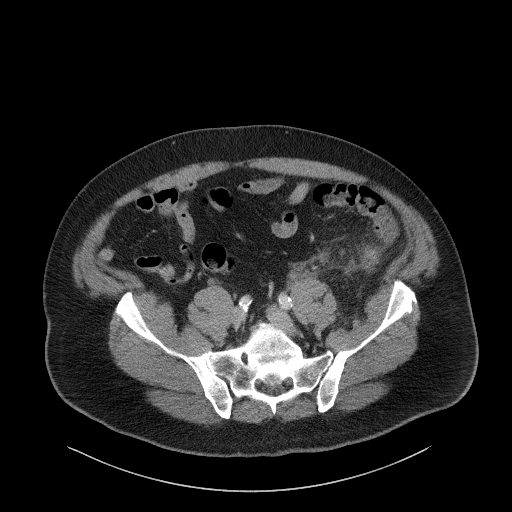
[im 56/102  soft-tissue]
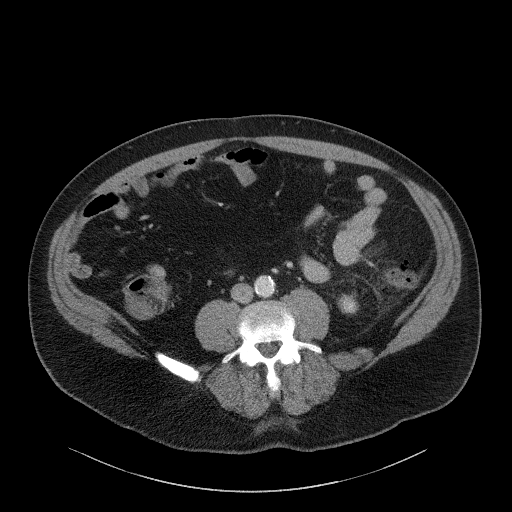
[im 61/102  soft-tissue]
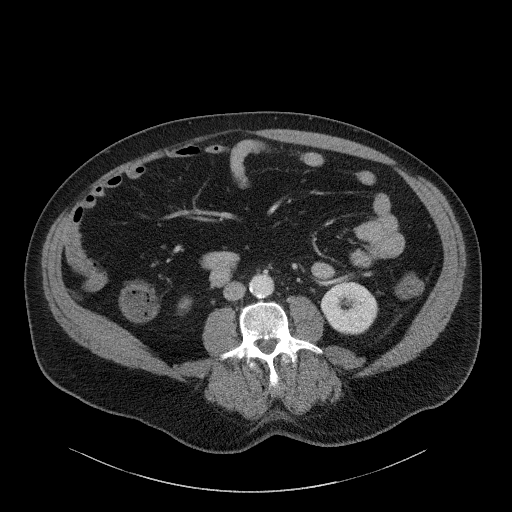
[im 61/102  bone]
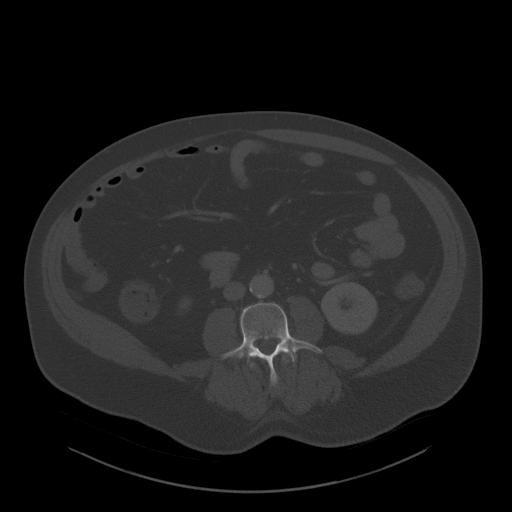
[im 66/102  soft-tissue]
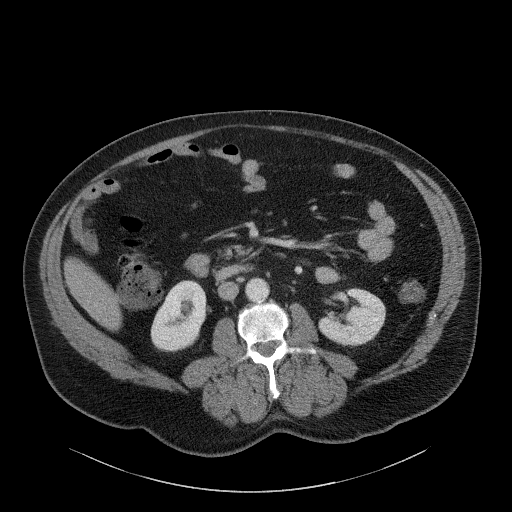
[im 76/102  soft-tissue]
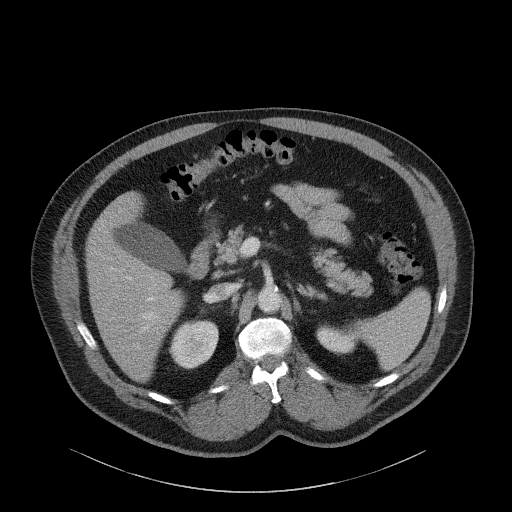
[im 81/102  soft-tissue]
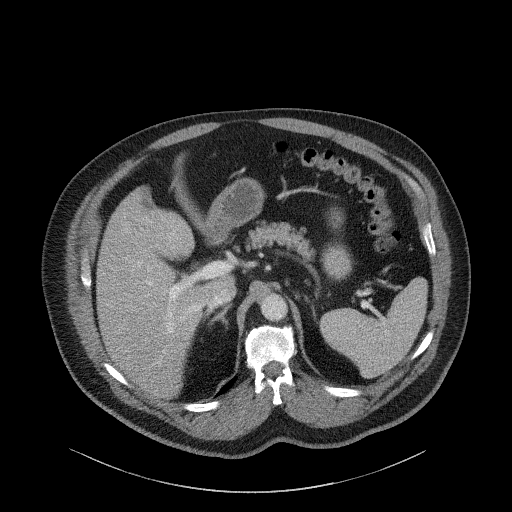
[im 86/102  soft-tissue]
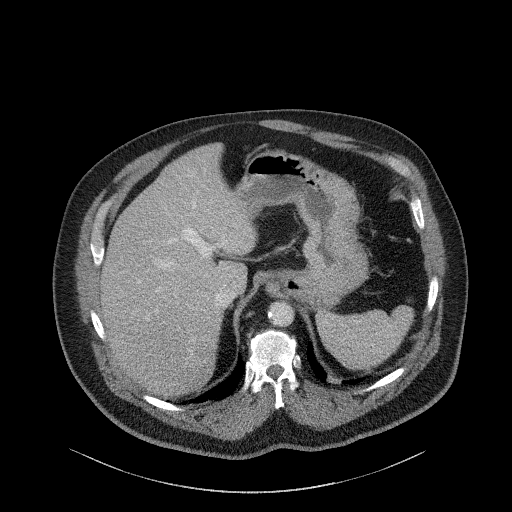
[im 96/102  soft-tissue]
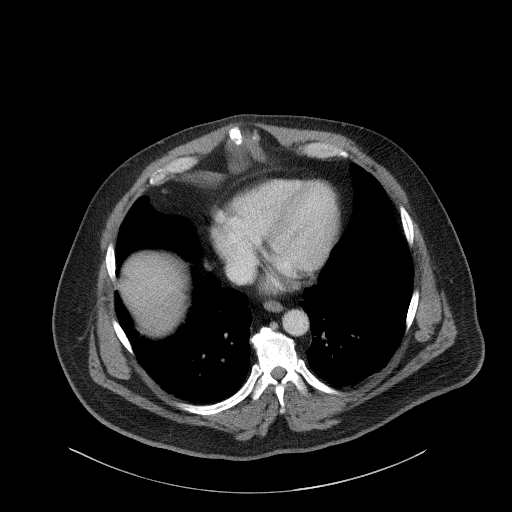

[Series 6: a/p w/ cor · coronal · 0.99mm/px · 3 of 183 slices shown]
[im 61/183  soft-tissue]
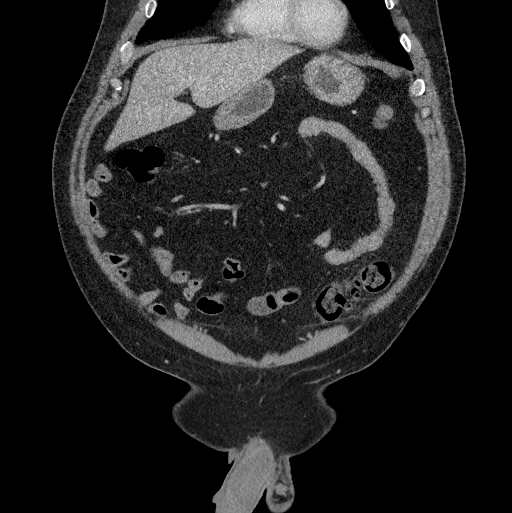
[im 81/183  soft-tissue]
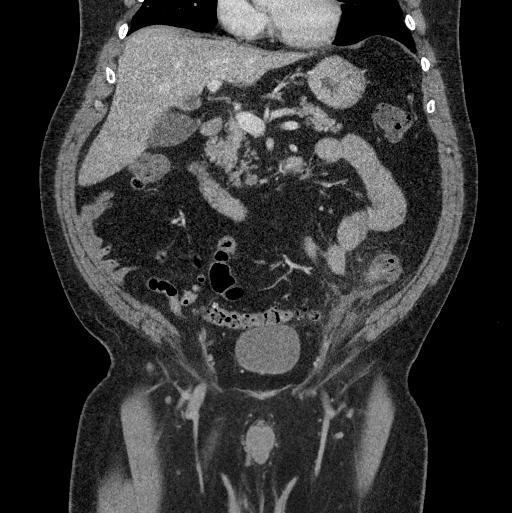
[im 102/183  soft-tissue]
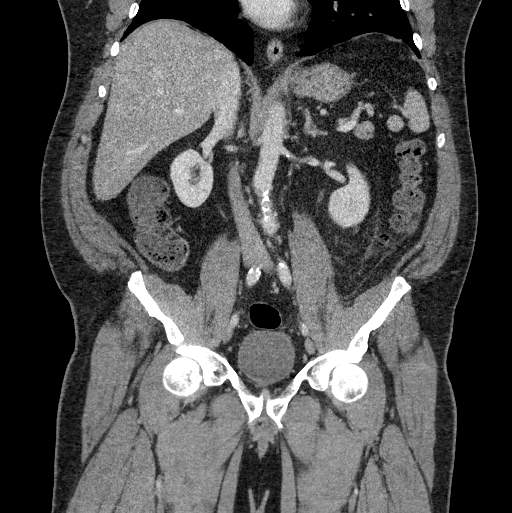

[17 of 46 positions shown; findings below may reference images not displayed]

FINDINGS: Lower chest: No acute abnormality. RIGHT infrahilar air cyst,
incompletely evaluated.

Hepatobiliary: No focal liver abnormality is seen. No gallstones,
gallbladder wall thickening, or biliary dilatation.

Pancreas: Unremarkable. No pancreatic ductal dilatation or
surrounding inflammatory changes.

Spleen: Normal in size without focal abnormality.

Adrenals/Urinary Tract: Adrenal glands are unremarkable. Kidneys are
normal, without renal calculi, focal lesion, or hydronephrosis.
Bladder is unremarkable.

Stomach/Bowel: Normal stomach, small bowel, and appendix. Extensive
colonic diverticulosis. Colonic diverticulitis affecting the
descending colon and sigmoid regions, with pericolonic stranding and
a small amount of poorly contained fluid adjacent to the peritoneal
reflection extending along the anterior margin of the psoas within
the retroperitoneum, consistent with phlegmon. Tiny microperforation
is likely, see series 6, image 81.

Vascular/Lymphatic: Aortic atherosclerosis. No enlarged abdominal or
pelvic lymph nodes.

Reproductive: Prostate is unremarkable.

Other: BILATERAL fat containing inguinal hernias. No abdominopelvic
ascites.

Musculoskeletal: No acute or significant osseous findings.
IMPRESSION: Descending colon and sigmoid colon diverticulitis, with pericolonic
phlegmon, likely small microperforation, but no free intra-abdominal
air.

Aortic Atherosclerosis (CJ38B-QH0.0).

## 2020-01-08 DIAGNOSIS — I10 Essential (primary) hypertension: Secondary | ICD-10-CM | POA: Diagnosis not present

## 2020-01-08 DIAGNOSIS — Z0000 Encounter for general adult medical examination without abnormal findings: Secondary | ICD-10-CM | POA: Diagnosis not present

## 2020-01-08 DIAGNOSIS — E7800 Pure hypercholesterolemia, unspecified: Secondary | ICD-10-CM | POA: Diagnosis not present

## 2020-01-15 DIAGNOSIS — Z0000 Encounter for general adult medical examination without abnormal findings: Secondary | ICD-10-CM | POA: Diagnosis not present

## 2020-01-15 DIAGNOSIS — Z125 Encounter for screening for malignant neoplasm of prostate: Secondary | ICD-10-CM | POA: Diagnosis not present

## 2020-01-15 DIAGNOSIS — I25111 Atherosclerotic heart disease of native coronary artery with angina pectoris with documented spasm: Secondary | ICD-10-CM | POA: Diagnosis not present

## 2020-01-15 DIAGNOSIS — E7800 Pure hypercholesterolemia, unspecified: Secondary | ICD-10-CM | POA: Diagnosis not present

## 2020-01-15 DIAGNOSIS — I252 Old myocardial infarction: Secondary | ICD-10-CM | POA: Diagnosis not present

## 2020-01-15 DIAGNOSIS — C44619 Basal cell carcinoma of skin of left upper limb, including shoulder: Secondary | ICD-10-CM | POA: Diagnosis not present

## 2020-03-06 ENCOUNTER — Other Ambulatory Visit: Payer: Self-pay | Admitting: Physician Assistant

## 2020-03-29 DIAGNOSIS — C44519 Basal cell carcinoma of skin of other part of trunk: Secondary | ICD-10-CM | POA: Diagnosis not present

## 2020-03-29 DIAGNOSIS — L821 Other seborrheic keratosis: Secondary | ICD-10-CM | POA: Diagnosis not present

## 2020-03-29 DIAGNOSIS — B078 Other viral warts: Secondary | ICD-10-CM | POA: Diagnosis not present

## 2020-04-08 ENCOUNTER — Other Ambulatory Visit: Payer: Self-pay | Admitting: Physician Assistant

## 2020-05-03 ENCOUNTER — Other Ambulatory Visit: Payer: Self-pay

## 2020-05-03 ENCOUNTER — Encounter: Payer: Self-pay | Admitting: Cardiovascular Disease

## 2020-05-03 ENCOUNTER — Ambulatory Visit: Payer: BC Managed Care – PPO | Admitting: Cardiovascular Disease

## 2020-05-03 VITALS — BP 132/88 | HR 59 | Ht 70.0 in | Wt 252.8 lb

## 2020-05-03 DIAGNOSIS — I2510 Atherosclerotic heart disease of native coronary artery without angina pectoris: Secondary | ICD-10-CM | POA: Diagnosis not present

## 2020-05-03 DIAGNOSIS — I10 Essential (primary) hypertension: Secondary | ICD-10-CM

## 2020-05-03 DIAGNOSIS — E785 Hyperlipidemia, unspecified: Secondary | ICD-10-CM

## 2020-05-03 DIAGNOSIS — I519 Heart disease, unspecified: Secondary | ICD-10-CM | POA: Diagnosis not present

## 2020-05-03 NOTE — Patient Instructions (Signed)

## 2020-05-03 NOTE — Progress Notes (Signed)
xz   Cardiology Office Note    Date:  05/05/2020   ID:  Robert Dyer, DOB [birth year], MRN [id number]  PCP:  Patient, No Pcp Per  Cardiologist:  Thurmon Fair, MD   Chief Complaint  Patient presents with   Coronary Artery Disease    History of Present Illness:  Robert Dyer is a 59 y.o. male with a history of coronary artery disease and non-STEMI in May 2012 treated with cutting balloon angioplasty of the ramus intermedius artery, mildly reduced LVEF (EF 45-50%), hyperlipidemia, hypertension, obesity, ongoing tobacco abuse, anxiety disorder and GERD, previously  Dr. Yevonne Pax patient.  He has successfully stayed off cigarettes, but unfortunately has gained weight.  His BMI stated 136.  He has no cardiovascular complaints.  The patient specifically denies any chest pain at rest exertion, dyspnea at rest or with exertion, orthopnea, paroxysmal nocturnal dyspnea, syncope, palpitations, focal neurological deficits, intermittent claudication, lower extremity edema, unexplained weight gain, cough, hemoptysis or wheezing.  His most recent LDL cholesterol May 2021 was 73, but his HDL remains quite low at 25.  He also has mild hypertriglyceridemia at 193.  He does not have full-blown diabetes mellitus.  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     Past Medical History:  Diagnosis Date   Coronary artery disease    Diverticulosis    GERD (gastroesophageal reflux disease)    Hyperlipidemia    Hypertension    MI, acute, non ST segment elevation (HCC)    s/p cutting balloon PTCA to the ramus intermedius   Tobacco abuse, in remission     Past Surgical History:  Procedure Laterality Date   CARDIAC CATHETERIZATION  02/03/2011   revealing a total occlusion of the mid first diagonal with a 99% ostial stenosis of the ramus intermedius, and otherwise nonobstructive  disease.  EF was 45% with distal anterolateral  akinesis.  The first  diagonal was felt to be the infarct vessel that was felt that it was  likely occluded for some time and was not likely to benefit with  reperfusion.     COLONOSCOPY  2012   Dr. Elnoria Robert   CORONARY ANGIOPLASTY WITH STENT PLACEMENT  02/03/2011   s/p cutting balloon PTCA to the ramus intermediate   HEMORROIDECTOMY      Current Medications: Outpatient Medications Prior to Visit  Medication Sig Dispense Refill   ALPRAZolam (XANAX) 0.25 MG tablet Take 0.25 mg by mouth 2 (two) times daily as needed for anxiety.     aspirin 81 MG tablet Take 81 mg by mouth daily.       lisinopril (ZESTRIL) 10 MG tablet TAKE 1 TABLET BY MOUTH EVERY DAY 90 tablet 0   metoprolol tartrate (LOPRESSOR) 25 MG tablet Take 0.5 tablets (12.5 mg total) by mouth 2 (two) times daily. 90 tablet 3   nitroGLYCERIN (NITROSTAT) 0.4 MG SL tablet Place 1 tablet (0.4 mg total) under the tongue every 5 (five) minutes as needed for chest pain. 25 tablet 3   pantoprazole (PROTONIX) 40 MG tablet TAKE 1 TABLET BY MOUTH EVERY DAY 30 tablet 11   rosuvastatin (CRESTOR) 10 MG tablet TAKE 1 TABLET BY MOUTH EVERY DAY 90 tablet 0   No facility-administered medications prior to visit.     Allergies:   Statins   Social History   Socioeconomic History   Marital status: Married    Spouse name: Not on file   Number of children: Not  on file   Years of education: Not on file   Highest education level: Not on file  Occupational History   Not on file  Tobacco Use   Smoking status: Former Smoker    Types: Cigarettes    Quit date: 09/07/2016    Years since quitting: 3.6   Smokeless tobacco: Former Engineer, water and Sexual Activity   Alcohol use: Yes   Drug use: Yes    Types: Marijuana   Sexual activity: Not on file  Other Topics Concern   Not on file  Social History Narrative   Not on file   Social Determinants of Health   Financial Resource Strain:    Difficulty of Paying Living Expenses:  Not on file  Food Insecurity:    Worried About Programme researcher, broadcasting/film/video in the Last Year: Not on file   [employer] of Food in the Last Year: Not on file  Transportation Needs:    Lack of Transportation (Medical): Not on file   Lack of Transportation (Non-Medical): Not on file  Physical Activity:    Days of Exercise per Week: Not on file   Minutes of Exercise per Session: Not on file  Stress:    Feeling of Stress : Not on file  Social Connections:    Frequency of Communication with Friends and Family: Not on file   Frequency of Social Gatherings with Friends and Family: Not on file   Attends Religious Services: Not on file   Active Member of Clubs or Organizations: Not on file   Attends Banker Meetings: Not on file   Marital Status: Not on file     Family History:  The patient's family history includes Heart attack in his mother; Kidney disease in his father; Liver disease in his father.   ROS:   Please see the history of present illness.    ROS All other systems reviewed and are negative.   PHYSICAL EXAM:   VS:  BP 132/88    Pulse (!) 59    Ht 5\' 10"  (1.778 m)    Wt 252 lb 12.8 oz (114.7 kg)    SpO2 99%    BMI 36.27 kg/m     General: Alert, oriented x3, no distress, moderately obese Head: no evidence of trauma, PERRL, EOMI, no exophtalmos or lid lag, no myxedema, no xanthelasma; normal ears, nose and oropharynx Neck: normal jugular venous pulsations and no hepatojugular reflux; brisk carotid pulses without delay and no carotid bruits Chest: clear to auscultation, no signs of consolidation by percussion or palpation, normal fremitus, symmetrical and full respiratory excursions Cardiovascular: normal position and quality of the apical impulse, regular rhythm, normal first and second heart sounds, no murmurs, rubs or gallops Abdomen: no tenderness or distention, no masses by palpation, no abnormal pulsatility or arterial bruits, normal bowel sounds, no  hepatosplenomegaly Extremities: no clubbing, cyanosis or edema; 2+ radial, ulnar and brachial pulses bilaterally; 2+ right femoral, posterior tibial and dorsalis pedis pulses; 2+ left femoral, posterior tibial and dorsalis pedis pulses; no subclavian or femoral bruits Neurological: grossly nonfocal Psych: Normal mood and affect   Wt Readings from Last 3 Encounters:  05/03/20 252 lb 12.8 oz (114.7 kg)  04/24/19 239 lb (108.4 kg)  12/09/17 239 lb 6.7 oz (108.6 kg)      Studies/Labs Reviewed:   EKG:  EKG is ordered today.  It shows normal sinus rhythm and is a normal tracing  Recent Labs: No results found for requested labs within  last 8760 hours.   Lipid Panel    Component Value Date/Time   CHOL 110 11/08/2017 0924   TRIG 87 11/08/2017 0924   HDL 26 (L) 11/08/2017 0924   CHOLHDL 4.2 11/08/2017 0924   CHOLHDL 4.6 11/05/2016 0947   VLDL 28 11/05/2016 0947   LDLCALC 67 11/08/2017 0924   01/08/2020 HDL 25, LDL 73, triglycerides 193  ASSESSMENT:    1. Coronary artery disease involving native coronary artery of native heart without angina pectoris   2. Left ventricular systolic dysfunction without heart failure   3. Essential hypertension   4. Dyslipidemia (high LDL; low HDL)   5. Severe obesity (BMI 35.0-39.9) with comorbidity (HCC)      PLAN:  In order of problems listed above:  1. CAD: Asymptomatic despite an active lifestyle. 2. LV systolic dysfunction: Mild, without clinical heart failure.  EF 45-50%. 3. HTN: Well-controlled. 4. HLP: LDL very close to target range on statin.  Encouraged him to try again to lose weight and exercise since this is the only way he will improve his HDL. 5. Obesity: Discussed calorie and carbohydrate restriction, reduce intake of saturated fat with higher intake of fish, oils, nuts and lean protein; increase physical exercise.    Medication Adjustments/Labs and Tests Ordered: Current medicines are reviewed at length with the patient  today.  Concerns regarding medicines are outlined above.  Medication changes, Labs and Tests ordered today are listed in the Patient Instructions below. Patient Instructions  Medication Instructions:  No changes *If you need a refill on your cardiac medications before your next appointment, please call your pharmacy*   Lab Work: None ordered If you have labs (blood work) drawn today and your tests are completely normal, you will receive your results only by:  MyChart Message (if you have MyChart) OR  A paper copy in the mail If you have any lab test that is abnormal or we need to change your treatment, we will call you to review the results.   Testing/Procedures: None ordered   Follow-Up: At [facility], you and your health needs are our priority.  As part of our continuing mission to provide you with exceptional heart care, we have created designated Provider Care Teams.  These Care Teams include your primary Cardiologist (physician) and Advanced Practice Providers (APPs -  Physician Assistants and Nurse Practitioners) who all work together to provide you with the care you need, when you need it.  We recommend signing up for the patient portal called "MyChart".  Sign up information is provided on this After Visit Summary.  MyChart is used to connect with patients for Virtual Visits (Telemedicine).  Patients are able to view lab/test results, encounter notes, upcoming appointments, etc.  Non-urgent messages can be sent to your provider as well.   To learn more about what you can do with MyChart, go to [email or web address].    Your next appointment:   12 month(s)  The format for your next appointment:   In Person  Provider:   You may see Thurmon Fair, MD or one of the following Advanced Practice Providers on your designated Care Team:    Azalee Course, PA-C  Micah Flesher, [place] or   Judy Pimple, PA-C      Signed, Thurmon Fair, MD  05/05/2020 4:34 PM    [facility] [address] [place], [place], [place]  [address] Phone: [phone]; Fax: [phone]

## 2020-05-05 ENCOUNTER — Encounter: Payer: Self-pay | Admitting: Cardiovascular Disease

## 2020-05-10 ENCOUNTER — Other Ambulatory Visit: Payer: Self-pay | Admitting: Physician Assistant

## 2020-07-17 ENCOUNTER — Other Ambulatory Visit: Payer: Self-pay | Admitting: Physician Assistant

## 2020-07-22 DIAGNOSIS — I25111 Atherosclerotic heart disease of native coronary artery with angina pectoris with documented spasm: Secondary | ICD-10-CM | POA: Diagnosis not present

## 2020-08-14 DIAGNOSIS — Z23 Encounter for immunization: Secondary | ICD-10-CM | POA: Diagnosis not present

## 2020-12-18 ENCOUNTER — Other Ambulatory Visit: Payer: Self-pay | Admitting: Physician Assistant

## 2021-01-13 DIAGNOSIS — Z0000 Encounter for general adult medical examination without abnormal findings: Secondary | ICD-10-CM | POA: Diagnosis not present

## 2021-01-13 DIAGNOSIS — Z125 Encounter for screening for malignant neoplasm of prostate: Secondary | ICD-10-CM | POA: Diagnosis not present

## 2021-01-20 DIAGNOSIS — R7301 Impaired fasting glucose: Secondary | ICD-10-CM | POA: Diagnosis not present

## 2021-01-20 DIAGNOSIS — I25111 Atherosclerotic heart disease of native coronary artery with angina pectoris with documented spasm: Secondary | ICD-10-CM | POA: Diagnosis not present

## 2021-01-20 DIAGNOSIS — Z23 Encounter for immunization: Secondary | ICD-10-CM | POA: Diagnosis not present

## 2021-01-20 DIAGNOSIS — I252 Old myocardial infarction: Secondary | ICD-10-CM | POA: Diagnosis not present

## 2021-01-20 DIAGNOSIS — Z0000 Encounter for general adult medical examination without abnormal findings: Secondary | ICD-10-CM | POA: Diagnosis not present

## 2021-01-27 DIAGNOSIS — I10 Essential (primary) hypertension: Secondary | ICD-10-CM | POA: Diagnosis not present

## 2021-01-27 DIAGNOSIS — Z0000 Encounter for general adult medical examination without abnormal findings: Secondary | ICD-10-CM | POA: Diagnosis not present

## 2021-05-20 ENCOUNTER — Other Ambulatory Visit: Payer: Self-pay | Admitting: Cardiovascular Disease

## 2021-07-14 DIAGNOSIS — E7800 Pure hypercholesterolemia, unspecified: Secondary | ICD-10-CM | POA: Diagnosis not present

## 2021-07-16 ENCOUNTER — Other Ambulatory Visit: Payer: Self-pay | Admitting: Cardiovascular Disease

## 2021-07-21 DIAGNOSIS — E7800 Pure hypercholesterolemia, unspecified: Secondary | ICD-10-CM | POA: Diagnosis not present

## 2021-07-21 DIAGNOSIS — L820 Inflamed seborrheic keratosis: Secondary | ICD-10-CM | POA: Diagnosis not present

## 2021-07-21 DIAGNOSIS — I10 Essential (primary) hypertension: Secondary | ICD-10-CM | POA: Diagnosis not present

## 2021-07-21 DIAGNOSIS — I25111 Atherosclerotic heart disease of native coronary artery with angina pectoris with documented spasm: Secondary | ICD-10-CM | POA: Diagnosis not present

## 2021-07-21 DIAGNOSIS — I252 Old myocardial infarction: Secondary | ICD-10-CM | POA: Diagnosis not present

## 2021-08-06 ENCOUNTER — Other Ambulatory Visit: Payer: Self-pay

## 2021-08-06 ENCOUNTER — Ambulatory Visit: Payer: BC Managed Care – PPO | Admitting: Cardiovascular Disease

## 2021-08-06 ENCOUNTER — Encounter: Payer: Self-pay | Admitting: Cardiovascular Disease

## 2021-08-06 VITALS — BP 130/77 | HR 60 | Ht 70.0 in | Wt 260.6 lb

## 2021-08-06 DIAGNOSIS — I519 Heart disease, unspecified: Secondary | ICD-10-CM | POA: Diagnosis not present

## 2021-08-06 DIAGNOSIS — I2510 Atherosclerotic heart disease of native coronary artery without angina pectoris: Secondary | ICD-10-CM

## 2021-08-06 DIAGNOSIS — I10 Essential (primary) hypertension: Secondary | ICD-10-CM

## 2021-08-06 DIAGNOSIS — E785 Hyperlipidemia, unspecified: Secondary | ICD-10-CM

## 2021-08-06 MED ORDER — ROSUVASTATIN CALCIUM 20 MG PO TABS: 20.0000 mg | ORAL_TABLET | Freq: Every day | ORAL | 3 refills | Status: DC

## 2021-08-06 NOTE — Patient Instructions (Addendum)
Medication Instructions:  INCREASE the Rosuvastatin to 20 mg once daily  *If you need a refill on your cardiac medications before your next appointment, please call your pharmacy*   Lab Work: None ordered If you have labs (blood work) drawn today and your tests are completely normal, you will receive your results only by: MyChart Message (if you have MyChart) OR A paper copy in the mail If you have any lab test that is abnormal or we need to change your treatment, we will call you to review the results.   Testing/Procedures: None ordered   Follow-Up: At [facility], you and your health needs are our priority.  As part of our continuing mission to provide you with exceptional heart care, we have created designated Provider Care Teams.  These Care Teams include your primary Cardiologist (physician) and Advanced Practice Providers (APPs -  Physician Assistants and Nurse Practitioners) who all work together to provide you with the care you need, when you need it.  We recommend signing up for the patient portal called "MyChart".  Sign up information is provided on this After Visit Summary.  MyChart is used to connect with patients for Virtual Visits (Telemedicine).  Patients are able to view lab/test results, encounter notes, upcoming appointments, etc.  Non-urgent messages can be sent to your provider as well.   To learn more about what you can do with MyChart, go to [email or web address].    Your next appointment:   12 month(s)  The format for your next appointment:   In Person  Provider:   Mihai Croitoru, MD     

## 2021-08-06 NOTE — Progress Notes (Signed)
xz   Cardiology Office Note    Date:  08/11/2021   ID:  Robert Dyer, DOB June 08, 1961, MRN [id number]  PCP:  Patient, No Pcp Per (Inactive)  Cardiologist:  Thurmon Fair, MD   No chief complaint on file.   History of Present Illness:  Robert Dyer is a 60 y.o. male with a history of coronary artery disease and non-STEMI in May 2012 treated with cutting balloon angioplasty of the ramus intermedius artery, mildly reduced LVEF (EF 45-50%), hyperlipidemia, hypertension, obesity, ongoing tobacco abuse, anxiety disorder and GERD, previously  Dr. Yevonne Pax patient.  He feels well.  He has not restarted smoking, but  also unable to lose any weight.  In fact he has gained 8 pounds.  BMI 37.  Blood pressure is well controlled.  Recent lipid profile showed an LDL cholesterol of 81 and a very low HDL of 29.  Triglycerides mildly elevated 198.  Also consistent with insulin resistance/metabolic syndrome are his hemoglobin A1c of 6.0% and fasting glucose of 122, in prediabetes range.  The patient specifically denies any chest pain at rest exertion, dyspnea at rest or with exertion, orthopnea, paroxysmal nocturnal dyspnea, syncope, palpitations, focal neurological deficits, intermittent claudication, lower extremity edema, unexplained weight gain, cough, hemoptysis or wheezing.  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     Past Medical History:  Diagnosis Date   Coronary artery disease    Diverticulosis    GERD (gastroesophageal reflux disease)    Hyperlipidemia    Hypertension    MI, acute, non ST segment elevation (HCC)    s/p cutting balloon PTCA to the ramus intermedius   Tobacco abuse, in remission     Past Surgical History:  Procedure Laterality Date   CARDIAC CATHETERIZATION  02/03/2011   revealing a total occlusion of the mid first diagonal with a 99% ostial stenosis of the ramus intermedius, and otherwise  nonobstructive  disease.  EF was 45% with distal anterolateral akinesis.  The first  diagonal was felt to be the infarct vessel that was felt that it was  likely occluded for some time and was not likely to benefit with  reperfusion.     COLONOSCOPY  2012   Dr. Elnoria Howard   CORONARY ANGIOPLASTY WITH STENT PLACEMENT  02/03/2011   s/p cutting balloon PTCA to the ramus intermediate   HEMORROIDECTOMY      Current Medications: Outpatient Medications Prior to Visit  Medication Sig Dispense Refill   ALPRAZolam (XANAX) 0.25 MG tablet Take 0.25 mg by mouth 2 (two) times daily as needed for anxiety.     aspirin 81 MG tablet Take 81 mg by mouth daily.       lisinopril (ZESTRIL) 10 MG tablet Take 1 tablet (10 mg total) by mouth daily. Patient need a appointment for future refills. 90 tablet 1   metoprolol tartrate (LOPRESSOR) 25 MG tablet TAKE 0.5 TABLETS (12.5 MG TOTAL) BY MOUTH 2 (TWO) TIMES DAILY. 90 tablet 3   nitroGLYCERIN (NITROSTAT) 0.4 MG SL tablet Place 1 tablet (0.4 mg total) under the tongue every 5 (five) minutes as needed for chest pain. 25 tablet 3   pantoprazole (PROTONIX) 40 MG tablet TAKE 1 TABLET BY MOUTH EVERY DAY 90 tablet 0   rosuvastatin (CRESTOR) 10 MG tablet TAKE 1 TABLET BY MOUTH EVERY DAY 90 tablet 0   No facility-administered medications prior to visit.     Allergies:   Statins   Social  History   Socioeconomic History   Marital status: Married    Spouse name: Not on file   Number of children: Not on file   Years of education: Not on file   Highest education level: Not on file  Occupational History   Not on file  Tobacco Use   Smoking status: Former    Types: Cigarettes    Quit date: 09/07/2016    Years since quitting: 4.9   Smokeless tobacco: Former  Substance and Sexual Activity   Alcohol use: Yes   Drug use: Yes    Types: Marijuana   Sexual activity: Not on file  Other Topics Concern   Not on file  Social History Narrative   Not on file   Social Determinants  of Health   Financial Resource Strain: Not on file  Food Insecurity: Not on file  Transportation Needs: Not on file  Physical Activity: Not on file  Stress: Not on file  Social Connections: Not on file     Family History:  The patient's family history includes Heart attack in his mother; Kidney disease in his father; Liver disease in his father.   ROS:   Please see the history of present illness.    ROS All other systems reviewed and are negative.   PHYSICAL EXAM:   VS:  BP 130/77   Pulse 60   Ht 5\' 10"  (1.778 m)   Wt 260 lb 9.6 oz (118.2 kg)   SpO2 97%   BMI 37.39 kg/m     General: Alert, oriented x3, no distress, severely obese Head: no evidence of trauma, PERRL, EOMI, no exophtalmos or lid lag, no myxedema, no xanthelasma; normal ears, nose and oropharynx Neck: normal jugular venous pulsations and no hepatojugular reflux; brisk carotid pulses without delay and no carotid bruits Chest: clear to auscultation, no signs of consolidation by percussion or palpation, normal fremitus, symmetrical and full respiratory excursions Cardiovascular: normal position and quality of the apical impulse, regular rhythm, normal first and second heart sounds, no murmurs, rubs or gallops Abdomen: no tenderness or distention, no masses by palpation, no abnormal pulsatility or arterial bruits, normal bowel sounds, no hepatosplenomegaly Extremities: no clubbing, cyanosis or edema; 2+ radial, ulnar and brachial pulses bilaterally; 2+ right femoral, posterior tibial and dorsalis pedis pulses; 2+ left femoral, posterior tibial and dorsalis pedis pulses; no subclavian or femoral bruits Neurological: grossly nonfocal Psych: Normal mood and affect    Wt Readings from Last 3 Encounters:  08/06/21 260 lb 9.6 oz (118.2 kg)  05/03/20 252 lb 12.8 oz (114.7 kg)  04/24/19 239 lb (108.4 kg)      Studies/Labs Reviewed:   EKG:  EKG is ordered today.  Personally reviewed, shows normal sinus rhythm, normal  tracing  Recent Labs: No results found for requested labs within last 8760 hours.  07/16/2021 Hemoglobin A1c 6.0%, glucose 122, creatinine 1.1, hemoglobin 15.3, normal liver function tests  Lipid Panel    Component Value Date/Time   CHOL 110 11/08/2017 0924   TRIG 87 11/08/2017 0924   HDL 26 (L) 11/08/2017 0924   CHOLHDL 4.2 11/08/2017 0924   CHOLHDL 4.6 11/05/2016 0947   VLDL 28 11/05/2016 0947   LDLCALC 67 11/08/2017 0924   01/08/2020 HDL 25, LDL 73, triglycerides 193 03/09/2020 HDL 29, LDL 81, triglycerides 198, total cholesterol 150  ASSESSMENT:    1. Coronary artery disease involving native coronary artery of native heart without angina pectoris   2. Left ventricular systolic dysfunction without heart failure  3. Essential hypertension   4. Dyslipidemia (high LDL; low HDL)   5. Severe obesity (BMI 35.0-39.9) with comorbidity (HCC)      PLAN:  In order of problems listed above:  CAD: Angina free despite being very physically active.  On aspirin, statin, beta-blocker. LV systolic dysfunction: Mild (EF 45-50%, without ever experiencing clinical heart failure symptoms.  On ACE inhibitor and beta-blocker. HTN: Well-controlled. HLP: LDL has deteriorated since last year.  Increase rosuvastatin to 20 mg daily.  Encourage more physical activity and weight loss to improve the HDL cholesterol. Obesity: Reviewed healthy dietary changes, increasing the intake of unsaturated fats from fish, nuts, oils, avocado, and lean protein from fish and poultry, reducing intake of saturated fat and sweets and starches with high glycemic index.    Medication Adjustments/Labs and Tests Ordered: Current medicines are reviewed at length with the patient today.  Concerns regarding medicines are outlined above.  Medication changes, Labs and Tests ordered today are listed in the Patient Instructions below. Patient Instructions  Medication Instructions:  INCREASE the Rosuvastatin to 20 mg once  daily  *If you need a refill on your cardiac medications before your next appointment, please call your pharmacy*   Lab Work: None ordered If you have labs (blood work) drawn today and your tests are completely normal, you will receive your results only by: MyChart Message (if you have MyChart) OR A paper copy in the mail If you have any lab test that is abnormal or we need to change your treatment, we will call you to review the results.   Testing/Procedures: None ordered   Follow-Up: At [facility], you and your health needs are our priority.  As part of our continuing mission to provide you with exceptional heart care, we have created designated Provider Care Teams.  These Care Teams include your primary Cardiologist (physician) and Advanced Practice Providers (APPs -  Physician Assistants and Nurse Practitioners) who all work together to provide you with the care you need, when you need it.  We recommend signing up for the patient portal called "MyChart".  Sign up information is provided on this After Visit Summary.  MyChart is used to connect with patients for Virtual Visits (Telemedicine).  Patients are able to view lab/test results, encounter notes, upcoming appointments, etc.  Non-urgent messages can be sent to your provider as well.   To learn more about what you can do with MyChart, go to [email or web address].    Your next appointment:   12 month(s)  The format for your next appointment:   In Person  Provider:   Thurmon Fair, MD       Signed, Thurmon Fair, MD  08/11/2021 3:59 PM    [facility] [address] [place], [place], [place]  [address] Phone: [phone]; Fax: [phone]

## 2021-09-22 ENCOUNTER — Other Ambulatory Visit: Payer: Self-pay | Admitting: Cardiovascular Disease

## 2021-10-08 ENCOUNTER — Other Ambulatory Visit: Payer: Self-pay | Admitting: Cardiovascular Disease

## 2021-10-17 ENCOUNTER — Other Ambulatory Visit: Payer: Self-pay | Admitting: Cardiovascular Disease

## 2021-10-17 ENCOUNTER — Other Ambulatory Visit: Payer: Self-pay | Admitting: Physician Assistant

## 2022-01-19 DIAGNOSIS — Z0000 Encounter for general adult medical examination without abnormal findings: Secondary | ICD-10-CM | POA: Diagnosis not present

## 2022-01-19 DIAGNOSIS — Z125 Encounter for screening for malignant neoplasm of prostate: Secondary | ICD-10-CM | POA: Diagnosis not present

## 2022-01-19 DIAGNOSIS — R7301 Impaired fasting glucose: Secondary | ICD-10-CM | POA: Diagnosis not present

## 2022-01-19 DIAGNOSIS — E7800 Pure hypercholesterolemia, unspecified: Secondary | ICD-10-CM | POA: Diagnosis not present

## 2022-01-26 DIAGNOSIS — I25111 Atherosclerotic heart disease of native coronary artery with angina pectoris with documented spasm: Secondary | ICD-10-CM | POA: Diagnosis not present

## 2022-01-26 DIAGNOSIS — E7800 Pure hypercholesterolemia, unspecified: Secondary | ICD-10-CM | POA: Diagnosis not present

## 2022-01-26 DIAGNOSIS — I252 Old myocardial infarction: Secondary | ICD-10-CM | POA: Diagnosis not present

## 2022-01-26 DIAGNOSIS — Z0000 Encounter for general adult medical examination without abnormal findings: Secondary | ICD-10-CM | POA: Diagnosis not present

## 2022-03-20 ENCOUNTER — Other Ambulatory Visit: Payer: Self-pay | Admitting: Cardiovascular Disease

## 2022-07-20 DIAGNOSIS — R7303 Prediabetes: Secondary | ICD-10-CM | POA: Diagnosis not present

## 2022-07-20 DIAGNOSIS — E7800 Pure hypercholesterolemia, unspecified: Secondary | ICD-10-CM | POA: Diagnosis not present

## 2022-07-27 DIAGNOSIS — I252 Old myocardial infarction: Secondary | ICD-10-CM | POA: Diagnosis not present

## 2022-07-27 DIAGNOSIS — Z23 Encounter for immunization: Secondary | ICD-10-CM | POA: Diagnosis not present

## 2022-07-27 DIAGNOSIS — E7800 Pure hypercholesterolemia, unspecified: Secondary | ICD-10-CM | POA: Diagnosis not present

## 2022-07-27 DIAGNOSIS — I10 Essential (primary) hypertension: Secondary | ICD-10-CM | POA: Diagnosis not present

## 2022-07-27 DIAGNOSIS — B079 Viral wart, unspecified: Secondary | ICD-10-CM | POA: Diagnosis not present

## 2022-07-27 DIAGNOSIS — I25111 Atherosclerotic heart disease of native coronary artery with angina pectoris with documented spasm: Secondary | ICD-10-CM | POA: Diagnosis not present

## 2022-07-31 ENCOUNTER — Other Ambulatory Visit: Payer: Self-pay | Admitting: Cardiovascular Disease

## 2022-08-20 NOTE — Progress Notes (Signed)
Cardiology Clinic Note   Patient Name: Robert Dyer Date of Encounter: 08/21/2022  Primary Care Provider:  Patient, No Pcp Per Primary Cardiologist:  Thurmon Fair, MD  Patient Profile    Robert Dyer 61 year old male presents to the clinic today for follow-up evaluation of his coronary artery disease and hyperlipidemia.  Past Medical History    Past Medical History:  Diagnosis Date   Coronary artery disease    Diverticulosis    GERD (gastroesophageal reflux disease)    Hyperlipidemia    Hypertension    MI, acute, non ST segment elevation (HCC)    s/p cutting balloon PTCA to the ramus intermedius   Tobacco abuse, in remission    Past Surgical History:  Procedure Laterality Date   CARDIAC CATHETERIZATION  02/03/2011   revealing a total occlusion of the mid first diagonal with a 99% ostial stenosis of the ramus intermedius, and otherwise nonobstructive  disease.  EF was 45% with distal anterolateral akinesis.  The first  diagonal was felt to be the infarct vessel that was felt that it was  likely occluded for some time and was not likely to benefit with  reperfusion.     COLONOSCOPY  2012   Dr. Elnoria Howard   CORONARY ANGIOPLASTY WITH STENT PLACEMENT  02/03/2011   s/p cutting balloon PTCA to the ramus intermediate   HEMORROIDECTOMY      Allergies  Allergies  Allergen Reactions   Statins Rash    History of Present Illness    [name] has a PMH of hyperlipidemia, HTN, obesity, tobacco abuse, anxiety, GERD, and coronary artery disease with NSTEMI in May 2012.  He was treated with Cutting Balloon angioplasty to his RI.  He was noted to have mildly reduced LVEF 45-50%.  He was previously a patient of Dr. Patty Sermons.  He was seen in follow-up by Dr. Royann Shivers on 08/06/2021.  During that time he denied chest pain and dyspnea.  He denied syncope, palpitations, claudication, lower extremity swelling, and unexpected weight gain.  He had not restarted smoking but was  having trouble losing weight.  He had gained around 8 pounds.  His BMI was 37.  His blood pressure was well-controlled.  His LDL cholesterol was noted to be 81 with an HDL of 29.  His rosuvastatin was increased to 20 mg daily.  He presents to the clinic today for follow-up evaluation states he feels well.  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  He reports that he does not do any formal exercise.  He is working on his diet.  His wife has retired.  We reviewed the importance of heart healthy low-sodium high-fiber diet and reducing calories.  We reviewed his cardiac catheterization 5/12.  He expressed understanding.  His EKG today shows normal sinus rhythm 64 bpm with no ectopy.  We reviewed this recent lab work from his PCP.  Will plan follow-up in 12 months.  Today he denies chest pain, shortness of breath, lower extremity edema, fatigue, palpitations, melena, hematuria, hemoptysis, diaphoresis, weakness, presyncope, syncope, orthopnea, and PND.    Home Medications    Prior to Admission medications   Medication Sig Start Date End Date Taking? Authorizing Provider  ALPRAZolam (XANAX) 0.25 MG tablet Take 0.25 mg by mouth 2 (two) times daily as needed for anxiety.    [provider]  aspirin 81 MG tablet Take 81 mg by mouth daily.      [provider]  lisinopril (ZESTRIL) 10 MG  tablet Take 1 tablet (10 mg total) by mouth daily. Patient need annual appointment for future refills. 03/20/22   Croitoru, Mihai, MD  metoprolol tartrate (LOPRESSOR) 25 MG tablet Take 0.5 tablets (12.5 mg total) by mouth 2 (two) times daily. 10/17/21   Croitoru, Mihai, MD  nitroGLYCERIN (NITROSTAT) 0.4 MG SL tablet Place 1 tablet (0.4 mg total) under the tongue every 5 (five) minutes as needed for chest pain. 04/24/19   Azalee CourseMeng, Hao, PA  pantoprazole (PROTONIX) 40 MG tablet TAKE 1 TABLET BY MOUTH EVERY DAY 10/17/21   Croitoru, Mihai, MD  rosuvastatin (CRESTOR) 10 MG tablet TAKE 1 TABLET BY MOUTH  EVERY DAY 10/08/21   Croitoru, Mihai, MD  rosuvastatin (CRESTOR) 20 MG tablet TAKE 1 TABLET BY MOUTH EVERY DAY 08/03/22   Croitoru, Mihai, MD    Family History    Family History  Problem Relation Age of Onset   Heart attack Mother    Liver disease Father    Kidney disease Father    He indicated that his mother is deceased. He indicated that his father is deceased. He indicated that his maternal grandmother is deceased. He indicated that his maternal grandfather is deceased. He indicated that his paternal grandmother is deceased. He indicated that his paternal grandfather is deceased.  Social History    Social History   Socioeconomic History   Marital status: Married    Spouse name: Not on file   Number of children: Not on file   Years of education: Not on file   Highest education level: Not on file  Occupational History   Not on file  Tobacco Use   Smoking status: Former    Types: Cigarettes    Quit date: 09/07/2016    Years since quitting: 5.9   Smokeless tobacco: Former  Substance and Sexual Activity   Alcohol use: Yes   Drug use: Yes    Types: Marijuana   Sexual activity: Not on file  Other Topics Concern   Not on file  Social History Narrative   Not on file   Social Determinants of Health   Financial Resource Strain: Not on file  Food Insecurity: Not on file  Transportation Needs: Not on file  Physical Activity: Not on file  Stress: Not on file  Social Connections: Not on file  Intimate Partner Violence: Not on file     Review of Systems    General:  No chills, fever, night sweats or weight changes.  Cardiovascular:  No chest pain, dyspnea on exertion, edema, orthopnea, palpitations, paroxysmal nocturnal dyspnea. Dermatological: No rash, lesions/masses Respiratory: No cough, dyspnea Urologic: No hematuria, dysuria Abdominal:   No nausea, vomiting, diarrhea, bright red blood per rectum, melena, or hematemesis Neurologic:  No visual changes, wkns, changes in  mental status. All other systems reviewed and are otherwise negative except as noted above.  Physical Exam    VS:  BP 118/82 (BP Location: Left Arm, Patient Position: Sitting, Cuff Size: Normal)   Pulse 64   Ht 5\' 9"  (1.753 m)   Wt 269 lb 9.6 oz (122.3 kg)   BMI 39.81 kg/m  , BMI Body mass index is 39.81 kg/m. GEN: Well nourished, well developed, in no acute distress. HEENT: normal. Neck: Supple, no JVD, carotid bruits, or masses. Cardiac: RRR, no murmurs, rubs, or gallops. No clubbing, cyanosis, edema.  Radials/DP/PT 2+ and equal bilaterally.  Respiratory:  Respirations regular and unlabored, clear to auscultation bilaterally. GI: Soft, nontender, nondistended, BS + x 4. MS: no deformity or  atrophy. Skin: warm and dry, no rash. Neuro:  Strength and sensation are intact. Psych: Normal affect.  Accessory Clinical Findings    Recent Labs: No results found for requested labs within last 365 days.   Recent Lipid Panel    Component Value Date/Time   CHOL 110 11/08/2017 0924   TRIG 87 11/08/2017 0924   HDL 26 (L) 11/08/2017 0924   CHOLHDL 4.2 11/08/2017 0924   CHOLHDL 4.6 11/05/2016 0947   VLDL 28 11/05/2016 0947   LDLCALC 67 11/08/2017 0924         ECG personally reviewed by me today-normal sinus rhythm no ectopy 64 bpm- No acute changes  Echocardiogram 08/02/2013  LV EF: 45% -   50%   ------------------------------------------------------------  Indications:     CAD of native vessels 414.01. LV  dysfunction 429.9   ------------------------------------------------------------  History:  PMH:  Acquired from the patient and from the  patient's chart.  Coronary artery disease.  PMH:  Myocardial infarction.  Risk factors:  Current tobacco use.  Dyslipidemia.   ------------------------------------------------------------  Study Conclusions   - Left ventricle: Distal septal and apical hypokinesis    Cannot r/o calcified mural thrombus at apex Consider    contrast  study or MRI if clinically indicated The cavity    size was mildly dilated. Wall thickness was normal.    Systolic function was mildly reduced. The estimated    ejection fraction was in the range of 45% to 50%.  - Atrial septum: No defect or patent foramen ovale was    identified.   ------------------------------------------------------------  Labs, prior tests, procedures, and surgery:  Catheterization (May 2012).    The study demonstrated  coronary artery disease. There was a stenosis in the ramus  intermedius artery which was treated with cutting balloon  angioplasty.  EF was 45%.   Transthoracic echocardiography.  M-mode, complete 2D,  spectral Doppler, and color Doppler.  Height:  Height:  177.8cm. Height: 70in.  Weight:  Weight: 88.9kg. Weight:  195.6lb.  Body mass index:  BMI: 28.1kg/m^2.  Body surface  area:    BSA: 2.57m^2.  Blood pressure:     120/75.  Patient  status:  Outpatient.  Location:  [place] [facility]   ------------------------------------------------------------   ------------------------------------------------------------  Left ventricle:  Distal septal and apical hypokinesis Cannot  r/o calcified mural thrombus at apex Consider contrast study  or MRI if clinically indicated The cavity size was mildly  dilated. Wall thickness was normal. Systolic function was  mildly reduced. The estimated ejection fraction was in the  range of 45% to 50%.   ------------------------------------------------------------  Aortic valve:   Mildly calcified leaflets.  Doppler:   There  was no stenosis.    No regurgitation.   ------------------------------------------------------------  Mitral valve:   Mildly thickened leaflets .  Doppler:   No  significant regurgitation.    Peak gradient: 35mm Hg (D).   ------------------------------------------------------------  Left atrium:  The atrium was normal in size.   ------------------------------------------------------------   Atrial septum:  No defect or patent foramen ovale was  identified.   ------------------------------------------------------------  Right ventricle:  The cavity size was normal. Wall thickness  was normal. Systolic function was normal.   ------------------------------------------------------------  Pulmonic valve:    Structurally normal valve.   Cusp  separation was normal.  Doppler:  Transvalvular velocity was  within the normal range.  No significant regurgitation.   ------------------------------------------------------------  Tricuspid valve:   Structurally normal valve.   Leaflet  separation was normal.  Doppler:  Transvalvular velocity was  within the normal range.  Mild regurgitation.   ------------------------------------------------------------  Right atrium:  The atrium was normal in size.   ------------------------------------------------------------  Pericardium: The pericardium was normal in appearance.   ------------------------------------------------------------    Assessment & Plan   1.  Coronary artery disease-no chest pain today.  Denies recent episodes of arm neck back and chest discomfort.  Underwent cardiac catheterization with Cutting Balloon angioplasty to his ramus intermedius 5/12. Continue aspirin, rosuvastatin, metoprolol, lisinopril Heart healthy low-sodium diet-salty 6 given Increase physical activity as tolerated  Systolic LV dysfunction-post cath was noted to have mild reduction of his EF 45-50%.  Denies increased DOE or activity intolerance.  NYHA class I-2. Continue lisinopril, metoprolol Heart healthy low-sodium diet-salty 6 given Increase physical activity as tolerated  Hyperlipidemia-LDL 58 07/20/22 Continue aspirin, rosuvastatin Heart healthy low-sodium high-fiber diet Increase physical activity as tolerated Follows with PCP  Essential hypertension-BP today 118/82.  Well-controlled at home. Continue current medical  therapy  Obesity-weight today 269.6. Reviewed importance of heart healthy low-sodium diet and fiber. Increase physical activity as tolerated  Disposition: Follow-up with Dr. Royann Shivers in 1 year.   Thomasene Ripple. Ruthe Roemer NP-C     08/21/2022, 8:14 AM [facility] [address] Office [phone] Fax [phone]    I spent 14 minutes examining this patient, reviewing medications, and using patient centered shared decision making involving her cardiac care.  Prior to her visit I spent greater than 20 minutes reviewing her past medical history,  medications, and prior cardiac tests.

## 2022-08-21 ENCOUNTER — Encounter: Payer: Self-pay | Admitting: General Practice

## 2022-08-21 ENCOUNTER — Ambulatory Visit: Payer: BC Managed Care – PPO | Attending: General Practice | Admitting: General Practice

## 2022-08-21 VITALS — BP 118/82 | HR 64 | Ht 69.0 in | Wt 269.6 lb

## 2022-08-21 DIAGNOSIS — I10 Essential (primary) hypertension: Secondary | ICD-10-CM | POA: Diagnosis not present

## 2022-08-21 DIAGNOSIS — I2510 Atherosclerotic heart disease of native coronary artery without angina pectoris: Secondary | ICD-10-CM | POA: Diagnosis not present

## 2022-08-21 DIAGNOSIS — I519 Heart disease, unspecified: Secondary | ICD-10-CM | POA: Diagnosis not present

## 2022-08-21 DIAGNOSIS — E785 Hyperlipidemia, unspecified: Secondary | ICD-10-CM

## 2022-08-21 NOTE — Patient Instructions (Signed)
Medication Instructions:  The current medical regimen is effective;  continue present plan and medications as directed. Please refer to the Current Medication list given to you today.  *If you need a refill on your cardiac medications before your next appointment, please call your pharmacy*  Lab Work: NONE If you have labs (blood work) drawn today and your tests are completely normal, you will receive your results only by:  [place] (if you have MyChart) OR A paper copy in the mail If you have any lab test that is abnormal or we need to change your treatment, we will call you to review the results.  Testing/Procedures: NONE   Follow-Up: At [facility], you and your health needs are our priority.  As part of our continuing mission to provide you with exceptional heart care, we have created designated Provider Care Teams.  These Care Teams include your primary Cardiologist (physician) and Advanced Practice Providers (APPs -  Physician Assistants and Nurse Practitioners) who all work together to provide you with the care you need, when you need it.  Your next appointment:   12 month(s)  The format for your next appointment:   In Person  Provider:   Sanda Klein, MD    Other Instructions PLEASE READ AND FOLLOW HEART HEALTHY DIET ATTACHED  TRY TO EAT SMALLER PORTIONS AND CUT 200-500 CALORIES DAILY  Important Information About Sugar       Heart-Healthy Eating Plan Eating a healthy diet is important for the health of your heart. A heart-healthy eating plan includes: Eating less unhealthy fats. Eating more healthy fats. Eating less salt in your food. Salt is also called sodium. Making other changes in your diet. Talk with your doctor or a diet specialist (dietitian) to create an eating plan that is right for you. What is my plan? What are tips for following this plan? Cooking Avoid frying your food. Try to bake, boil, grill, or broil it instead. You can also  reduce fat by: Removing the skin from poultry. Removing all visible fats from meats. Steaming vegetables in water or broth. Meal planning  At meals, divide your plate into four equal parts: Fill one-half of your plate with vegetables and green salads. Fill one-fourth of your plate with whole grains. Fill one-fourth of your plate with lean protein foods. Eat 2-4 cups of vegetables per day. One cup of vegetables is: 1 cup (91 g) broccoli or cauliflower florets. 2 medium carrots. 1 large bell pepper. 1 large sweet potato. 1 large tomato. 1 medium white potato. 2 cups (150 g) raw leafy greens. Eat 1-2 cups of fruit per day. One cup of fruit is: 1 small apple 1 large banana 1 cup (237 g) mixed fruit, 1 large orange,  cup (82 g) dried fruit, 1 cup (240 mL) 100% fruit juice. Eat more foods that have soluble fiber. These are apples, broccoli, carrots, beans, peas, and barley. Try to get 20-30 g of fiber per day. Eat 4-5 servings of nuts, legumes, and seeds per week: 1 serving of dried beans or legumes equals  cup (90 g) cooked. 1 serving of nuts is  oz (12 almonds, 24 pistachios, or 7 walnut halves). 1 serving of seeds equals  oz (8 g). General information Eat more home-cooked food. Eat less restaurant, buffet, and fast food. Limit or avoid alcohol. Limit foods that are high in starch and sugar. Avoid fried foods. Lose weight if you are overweight. Keep track of how much salt (sodium) you eat. This  is important if you have high blood pressure. Ask your doctor to tell you more about this. Try to add vegetarian meals each week. Fats Choose healthy fats. These include olive oil and canola oil, flaxseeds, walnuts, almonds, and seeds. Eat more omega-3 fats. These include salmon, mackerel, sardines, tuna, flaxseed oil, and ground flaxseeds. Try to eat fish at least 2 times each week. Check food labels. Avoid foods with trans fats or high amounts of saturated fat. Limit saturated  fats. These are often found in animal products, such as meats, butter, and cream. These are also found in plant foods, such as palm oil, palm kernel oil, and coconut oil. Avoid foods with partially hydrogenated oils in them. These have trans fats. Examples are stick margarine, some tub margarines, cookies, crackers, and other baked goods. What foods should I eat? Fruits All fresh, canned (in natural juice), or frozen fruits. Vegetables Fresh or frozen vegetables (raw, steamed, roasted, or grilled). Green salads. Grains Most grains. Choose whole wheat and whole grains most of the time. Rice and pasta, including brown rice and pastas made with whole wheat. Meats and other proteins Lean, well-trimmed beef, veal, pork, and lamb. Chicken and [place] without skin. All fish and shellfish. Wild duck, rabbit, pheasant, and venison. Egg whites or low-cholesterol egg substitutes. Dried beans, peas, lentils, and tofu. Seeds and most nuts. Dairy Low-fat or nonfat cheeses, including ricotta and mozzarella. Skim or 1% milk that is liquid, powdered, or evaporated. Buttermilk that is made with low-fat milk. Nonfat or low-fat yogurt. Fats and oils Non-hydrogenated (trans-free) margarines. Vegetable oils, including soybean, sesame, sunflower, olive, peanut, safflower, corn, canola, and cottonseed. Salad dressings or mayonnaise made with a vegetable oil. Beverages Mineral water. Coffee and tea. Diet carbonated beverages. Sweets and desserts Sherbet, gelatin, and fruit ice. Small amounts of dark chocolate. Limit all sweets and desserts. Seasonings and condiments All seasonings and condiments. The items listed above may not be a complete list of foods and drinks you can eat. Contact a dietitian for more options. What foods should I avoid? Fruits Canned fruit in heavy syrup. Fruit in cream or butter sauce. Fried fruit. Limit coconut. Vegetables Vegetables cooked in cheese, cream, or butter sauce. Fried  vegetables. Grains Breads that are made with saturated or trans fats, oils, or whole milk. Croissants. Sweet rolls. Donuts. High-fat crackers, such as cheese crackers. Meats and other proteins Fatty meats, such as hot dogs, ribs, sausage, bacon, rib-eye roast or steak. High-fat deli meats, such as salami and bologna. Caviar. Domestic duck and goose. Organ meats, such as liver. Dairy Cream, sour cream, cream cheese, and creamed cottage cheese. Whole-milk cheeses. Whole or 2% milk that is liquid, evaporated, or condensed. Whole buttermilk. Cream sauce or high-fat cheese sauce. Yogurt that is made from whole milk. Fats and oils Meat fat, or shortening. Cocoa butter, hydrogenated oils, palm oil, coconut oil, palm kernel oil. Solid fats and shortenings, including bacon fat, salt pork, lard, and butter. Nondairy cream substitutes. Salad dressings with cheese or sour cream. Beverages Regular sodas and juice drinks with added sugar. Sweets and desserts Frosting. Pudding. Cookies. Cakes. Pies. Milk chocolate or white chocolate. Buttered syrups. Full-fat ice cream or ice cream drinks. The items listed above may not be a complete list of foods and drinks to avoid. Contact a dietitian for more information. Summary Heart-healthy meal planning includes eating less unhealthy fats, eating more healthy fats, and making other changes in your diet. Eat a balanced diet. This includes fruits and vegetables, low-fat or  nonfat dairy, lean protein, nuts and legumes, whole grains, and heart-healthy oils and fats. This information is not intended to replace advice given to you by your health care provider. Make sure you discuss any questions you have with your health care provider. Document Revised: 09/29/2021 Document Reviewed: 09/29/2021 Elsevier Patient Education  2023 [employer].

## 2022-08-25 ENCOUNTER — Encounter: Payer: Self-pay | Admitting: General Practice

## 2022-09-09 ENCOUNTER — Other Ambulatory Visit: Payer: Self-pay | Admitting: Cardiovascular Disease

## 2022-09-30 ENCOUNTER — Other Ambulatory Visit: Payer: Self-pay | Admitting: Cardiovascular Disease

## 2022-11-06 DIAGNOSIS — J069 Acute upper respiratory infection, unspecified: Secondary | ICD-10-CM | POA: Diagnosis not present

## 2022-11-24 ENCOUNTER — Other Ambulatory Visit: Payer: Self-pay | Admitting: Cardiovascular Disease

## 2023-01-25 DIAGNOSIS — F419 Anxiety disorder, unspecified: Secondary | ICD-10-CM | POA: Diagnosis not present

## 2023-01-25 DIAGNOSIS — E1151 Type 2 diabetes mellitus with diabetic peripheral angiopathy without gangrene: Secondary | ICD-10-CM | POA: Diagnosis not present

## 2023-01-25 DIAGNOSIS — Z125 Encounter for screening for malignant neoplasm of prostate: Secondary | ICD-10-CM | POA: Diagnosis not present

## 2023-01-25 DIAGNOSIS — E7800 Pure hypercholesterolemia, unspecified: Secondary | ICD-10-CM | POA: Diagnosis not present

## 2023-01-25 DIAGNOSIS — I25111 Atherosclerotic heart disease of native coronary artery with angina pectoris with documented spasm: Secondary | ICD-10-CM | POA: Diagnosis not present

## 2023-01-25 LAB — LAB REPORT - SCANNED
A1c: 8.2
EGFR: 62

## 2023-02-02 DIAGNOSIS — I25111 Atherosclerotic heart disease of native coronary artery with angina pectoris with documented spasm: Secondary | ICD-10-CM | POA: Diagnosis not present

## 2023-02-02 DIAGNOSIS — I252 Old myocardial infarction: Secondary | ICD-10-CM | POA: Diagnosis not present

## 2023-02-02 DIAGNOSIS — Z0000 Encounter for general adult medical examination without abnormal findings: Secondary | ICD-10-CM | POA: Diagnosis not present

## 2023-02-02 DIAGNOSIS — E7800 Pure hypercholesterolemia, unspecified: Secondary | ICD-10-CM | POA: Diagnosis not present

## 2023-02-24 ENCOUNTER — Telehealth: Payer: Self-pay | Admitting: Cardiovascular Disease

## 2023-02-24 MED ORDER — ROSUVASTATIN CALCIUM 20 MG PO TABS: 20.0000 mg | ORAL_TABLET | Freq: Every day | ORAL | 1 refills | Status: DC

## 2023-02-24 MED ORDER — METOPROLOL TARTRATE 25 MG PO TABS: ORAL_TABLET | ORAL | 1 refills | Status: DC

## 2023-02-24 MED ORDER — PANTOPRAZOLE SODIUM 40 MG PO TBEC: 40.0000 mg | DELAYED_RELEASE_TABLET | Freq: Every day | ORAL | 1 refills | Status: DC

## 2023-02-24 MED ORDER — LISINOPRIL 10 MG PO TABS: 10.0000 mg | ORAL_TABLET | Freq: Every day | ORAL | 1 refills | Status: DC

## 2023-02-24 NOTE — Telephone Encounter (Signed)
*  STAT* If patient is at the pharmacy, call can be transferred to refill team.   1. Which medications need to be refilled? (please list name of each medication and dose if known)  lisinopril (ZESTRIL) 10 MG tablet  pantoprazole (PROTONIX) 40 MG tablet  rosuvastatin (CRESTOR) 20 MG tablet  metoprolol tartrate (LOPRESSOR) 25 MG tablet   2. Which pharmacy/location (including street and city if local pharmacy) is medication to be sent to? [facility] - [place], [place] - [address]   3. Do they need a 30 day or 90 day supply? 90

## 2023-02-24 NOTE — Telephone Encounter (Signed)
Pt's medications were sent to pt's pharmacy as requested. Confirmation received.  

## 2023-03-20 LAB — LAB REPORT - SCANNED: EGFR: 64

## 2023-05-18 ENCOUNTER — Ambulatory Visit: Payer: Commercial Managed Care - HMO | Attending: Physician Assistant | Admitting: Physician Assistant

## 2023-05-18 ENCOUNTER — Encounter: Payer: Self-pay | Admitting: Physician Assistant

## 2023-05-18 VITALS — BP 128/76 | HR 80 | Ht 68.0 in | Wt 279.0 lb

## 2023-05-18 DIAGNOSIS — E785 Hyperlipidemia, unspecified: Secondary | ICD-10-CM

## 2023-05-18 DIAGNOSIS — I10 Essential (primary) hypertension: Secondary | ICD-10-CM | POA: Diagnosis not present

## 2023-05-18 DIAGNOSIS — I214 Non-ST elevation (NSTEMI) myocardial infarction: Secondary | ICD-10-CM

## 2023-05-18 DIAGNOSIS — I519 Heart disease, unspecified: Secondary | ICD-10-CM | POA: Diagnosis not present

## 2023-05-18 DIAGNOSIS — I2510 Atherosclerotic heart disease of native coronary artery without angina pectoris: Secondary | ICD-10-CM | POA: Diagnosis not present

## 2023-05-18 MED ORDER — NITROGLYCERIN 0.4 MG SL SUBL: 0.4000 mg | SUBLINGUAL_TABLET | SUBLINGUAL | 3 refills | Status: AC | PRN

## 2023-05-18 NOTE — Patient Instructions (Signed)
Medication Instructions:  NO CHANGES *If you need a refill on your cardiac medications before your next appointment, please call your pharmacy*   Lab Work: NO LABS If you have labs (blood work) drawn today and your tests are completely normal, you will receive your results only by: MyChart Message (if you have MyChart) OR A paper copy in the mail If you have any lab test that is abnormal or we need to change your treatment, we will call you to review the results.   Testing/Procedures: NO TESTING   Follow-Up: At [facility], you and your health needs are our priority.  As part of our continuing mission to provide you with exceptional heart care, we have created designated Provider Care Teams.  These Care Teams include your primary Cardiologist (physician) and Advanced Practice Providers (APPs -  Physician Assistants and Nurse Practitioners) who all work together to provide you with the care you need, when you need it.   Your next appointment:   1 year(s)  Provider:   Thurmon Fair, MD

## 2023-05-18 NOTE — Progress Notes (Unsigned)
  Cardiology Office Note:  .   Date:  05/20/2023  ID:  Robert Dyer, DOB Jul 01, 1961, MRN [id number] PCP: Patient, No Pcp Per   HeartCare Providers Cardiologist:  Thurmon Fair, MD     History of Present Illness: .   Robert Dyer is a 62 y.o. male with PMH of HTN, HLD, GERD, tobacco use, obesity and CAD s/p PTCA of RI in 01/2011. Last echocardiogram obtained on 08/02/2013 showed EF 45 to 50%.  Patient was last seen by Edd Fabian in December 2023 at which time he was doing well.   Patient presents today for follow-up.  He denies any exertional chest pain or worsening shortness of breath lately.  He has no lower extremity edema, orthopnea or PND.  EKG showed normal sinus rhythm without significant ST-T wave changes.  He can follow-up with cardiology service in 1 year.  ROS:   He denies chest pain, palpitations, dyspnea, pnd, orthopnea, n, v, dizziness, syncope, edema, weight gain, or early satiety. All other systems reviewed and are otherwise negative except as noted above.    Studies Reviewed: Marland Kitchen   EKG Interpretation Date/Time:  Tuesday May 18 2023 14:00:18 EDT Ventricular Rate:  80 PR Interval:  168 QRS Duration:  84 QT Interval:  376 QTC Calculation: 433 R Axis:   8  Text Interpretation: Normal sinus rhythm  No significant ST-T wave changes Confirmed by Azalee Course [phone]) on 05/20/2023 5:44:57 PM        Risk Assessment/Calculations:            Physical Exam:   VS:  BP 128/76 (BP Location: Left Arm, Patient Position: Sitting, Cuff Size: Large)   Pulse 80   Ht 5\' 8"  (1.727 m)   Wt 279 lb (126.6 kg)   SpO2 92%   BMI 42.42 kg/m    Wt Readings from Last 3 Encounters:  05/18/23 279 lb (126.6 kg)  08/21/22 269 lb 9.6 oz (122.3 kg)  08/06/21 260 lb 9.6 oz (118.2 kg)    GEN: Well nourished, well developed in no acute distress NECK: No JVD; No carotid bruits CARDIAC: RRR, no murmurs, rubs, gallops RESPIRATORY:  Clear to auscultation without rales,  wheezing or rhonchi  ABDOMEN: Soft, non-tender, non-distended EXTREMITIES:  No edema; No deformity   ASSESSMENT AND PLAN: .    CAD: Patient denies any recent chest pain.  Continue aspirin, metoprolol and Crestor  Ischemic cardiomyopathy: Previous echocardiogram in 2014 showed EF 45 to 50%.  He has not had any repeat echocardiogram since.  He appears to be euvolemic on exam  Hypertension: Blood pressure well-controlled  Hyperlipidemia: On Crestor       Dispo: Follow-up in 1 year  Signed, Azalee Course, [place]

## 2023-08-23 ENCOUNTER — Telehealth: Payer: Self-pay | Admitting: Cardiovascular Disease

## 2023-08-23 MED ORDER — LISINOPRIL 10 MG PO TABS: 10.0000 mg | ORAL_TABLET | Freq: Every day | ORAL | 2 refills | Status: DC

## 2023-08-23 MED ORDER — ROSUVASTATIN CALCIUM 20 MG PO TABS: 20.0000 mg | ORAL_TABLET | Freq: Every day | ORAL | 2 refills | Status: DC

## 2023-08-23 NOTE — Telephone Encounter (Signed)
Pt's medications were sent to pt's pharmacy as requested. Confirmation received.  

## 2023-08-23 NOTE — Telephone Encounter (Signed)
*  STAT* If patient is at the pharmacy, call can be transferred to refill team.   1. Which medications need to be refilled? (please list name of each medication and dose if known)  lisinopril (ZESTRIL) 10 MG tablet levothyroxine (SYNTHROID) 50 MCG tablet rosuvastatin (CRESTOR) 20 MG tablet  2. Which pharmacy/location (including street and city if local pharmacy) is medication to be sent to? [facility] -   Phone#: [phone] Fax#: [phone]  3. Do they need a 30 day or 90 day supply?   90 day supply

## 2023-09-30 ENCOUNTER — Other Ambulatory Visit: Payer: Self-pay | Admitting: Cardiovascular Disease

## 2023-10-26 ENCOUNTER — Other Ambulatory Visit: Payer: Self-pay | Admitting: Cardiovascular Disease

## 2023-11-09 DIAGNOSIS — E7800 Pure hypercholesterolemia, unspecified: Secondary | ICD-10-CM | POA: Diagnosis not present

## 2023-11-09 DIAGNOSIS — E1169 Type 2 diabetes mellitus with other specified complication: Secondary | ICD-10-CM | POA: Diagnosis not present

## 2023-11-09 DIAGNOSIS — I10 Essential (primary) hypertension: Secondary | ICD-10-CM | POA: Diagnosis not present

## 2023-11-09 DIAGNOSIS — E1165 Type 2 diabetes mellitus with hyperglycemia: Secondary | ICD-10-CM | POA: Diagnosis not present

## 2023-11-09 DIAGNOSIS — I252 Old myocardial infarction: Secondary | ICD-10-CM | POA: Diagnosis not present

## 2023-11-23 ENCOUNTER — Other Ambulatory Visit: Payer: Self-pay | Admitting: Cardiovascular Disease

## 2023-12-09 DIAGNOSIS — I252 Old myocardial infarction: Secondary | ICD-10-CM | POA: Diagnosis not present

## 2023-12-09 DIAGNOSIS — K219 Gastro-esophageal reflux disease without esophagitis: Secondary | ICD-10-CM | POA: Diagnosis not present

## 2023-12-09 DIAGNOSIS — Z7984 Long term (current) use of oral hypoglycemic drugs: Secondary | ICD-10-CM | POA: Diagnosis not present

## 2023-12-09 DIAGNOSIS — Z7985 Long-term (current) use of injectable non-insulin antidiabetic drugs: Secondary | ICD-10-CM | POA: Diagnosis not present

## 2023-12-09 DIAGNOSIS — I10 Essential (primary) hypertension: Secondary | ICD-10-CM | POA: Diagnosis not present

## 2023-12-09 DIAGNOSIS — E039 Hypothyroidism, unspecified: Secondary | ICD-10-CM | POA: Diagnosis not present

## 2023-12-09 DIAGNOSIS — Z008 Encounter for other general examination: Secondary | ICD-10-CM | POA: Diagnosis not present

## 2023-12-09 DIAGNOSIS — Z87891 Personal history of nicotine dependence: Secondary | ICD-10-CM | POA: Diagnosis not present

## 2023-12-09 DIAGNOSIS — Z888 Allergy status to other drugs, medicaments and biological substances status: Secondary | ICD-10-CM | POA: Diagnosis not present

## 2023-12-09 DIAGNOSIS — Z6835 Body mass index (BMI) 35.0-35.9, adult: Secondary | ICD-10-CM | POA: Diagnosis not present

## 2023-12-09 DIAGNOSIS — Z79890 Hormone replacement therapy (postmenopausal): Secondary | ICD-10-CM | POA: Diagnosis not present

## 2023-12-09 DIAGNOSIS — F325 Major depressive disorder, single episode, in full remission: Secondary | ICD-10-CM | POA: Diagnosis not present

## 2024-02-01 DIAGNOSIS — Z125 Encounter for screening for malignant neoplasm of prostate: Secondary | ICD-10-CM | POA: Diagnosis not present

## 2024-02-01 DIAGNOSIS — I252 Old myocardial infarction: Secondary | ICD-10-CM | POA: Diagnosis not present

## 2024-02-01 DIAGNOSIS — Z0000 Encounter for general adult medical examination without abnormal findings: Secondary | ICD-10-CM | POA: Diagnosis not present

## 2024-02-01 DIAGNOSIS — E7800 Pure hypercholesterolemia, unspecified: Secondary | ICD-10-CM | POA: Diagnosis not present

## 2024-02-01 DIAGNOSIS — R7303 Prediabetes: Secondary | ICD-10-CM | POA: Diagnosis not present

## 2024-02-01 DIAGNOSIS — I10 Essential (primary) hypertension: Secondary | ICD-10-CM | POA: Diagnosis not present

## 2024-02-08 DIAGNOSIS — E7800 Pure hypercholesterolemia, unspecified: Secondary | ICD-10-CM | POA: Diagnosis not present

## 2024-02-08 DIAGNOSIS — I10 Essential (primary) hypertension: Secondary | ICD-10-CM | POA: Diagnosis not present

## 2024-02-08 DIAGNOSIS — E1151 Type 2 diabetes mellitus with diabetic peripheral angiopathy without gangrene: Secondary | ICD-10-CM | POA: Diagnosis not present

## 2024-02-08 DIAGNOSIS — Z0000 Encounter for general adult medical examination without abnormal findings: Secondary | ICD-10-CM | POA: Diagnosis not present

## 2024-02-08 DIAGNOSIS — I25111 Atherosclerotic heart disease of native coronary artery with angina pectoris with documented spasm: Secondary | ICD-10-CM | POA: Diagnosis not present

## 2024-02-08 DIAGNOSIS — E039 Hypothyroidism, unspecified: Secondary | ICD-10-CM | POA: Diagnosis not present

## 2024-04-05 DIAGNOSIS — E1151 Type 2 diabetes mellitus with diabetic peripheral angiopathy without gangrene: Secondary | ICD-10-CM | POA: Diagnosis not present

## 2024-04-05 DIAGNOSIS — I252 Old myocardial infarction: Secondary | ICD-10-CM | POA: Diagnosis not present

## 2024-04-05 DIAGNOSIS — E1165 Type 2 diabetes mellitus with hyperglycemia: Secondary | ICD-10-CM | POA: Diagnosis not present

## 2024-04-05 DIAGNOSIS — E7800 Pure hypercholesterolemia, unspecified: Secondary | ICD-10-CM | POA: Diagnosis not present

## 2024-04-05 DIAGNOSIS — C4491 Basal cell carcinoma of skin, unspecified: Secondary | ICD-10-CM | POA: Diagnosis not present

## 2024-04-05 DIAGNOSIS — I10 Essential (primary) hypertension: Secondary | ICD-10-CM | POA: Diagnosis not present

## 2024-04-07 ENCOUNTER — Encounter: Payer: Self-pay | Admitting: Internal Medicine

## 2024-04-07 ENCOUNTER — Other Ambulatory Visit: Payer: Self-pay | Admitting: Internal Medicine

## 2024-04-07 DIAGNOSIS — C4491 Basal cell carcinoma of skin, unspecified: Secondary | ICD-10-CM

## 2024-04-10 ENCOUNTER — Ambulatory Visit
Admission: RE | Admit: 2024-04-10 | Discharge: 2024-04-10 | Disposition: A | Discharge status: Home / Self Care | Source: Ambulatory Visit | Attending: Internal Medicine | Admitting: Internal Medicine

## 2024-04-10 DIAGNOSIS — C4491 Basal cell carcinoma of skin, unspecified: Secondary | ICD-10-CM

## 2024-04-12 DIAGNOSIS — D489 Neoplasm of uncertain behavior, unspecified: Secondary | ICD-10-CM | POA: Diagnosis not present

## 2024-04-13 ENCOUNTER — Encounter: Payer: Self-pay | Admitting: Oncology

## 2024-04-13 ENCOUNTER — Encounter: Payer: Self-pay | Admitting: Internal Medicine

## 2024-04-13 ENCOUNTER — Other Ambulatory Visit: Payer: Self-pay | Admitting: Registered Nurse

## 2024-04-13 ENCOUNTER — Other Ambulatory Visit: Payer: Self-pay | Admitting: Internal Medicine

## 2024-04-13 ENCOUNTER — Other Ambulatory Visit (HOSPITAL_COMMUNITY): Payer: Self-pay | Admitting: Internal Medicine

## 2024-04-13 DIAGNOSIS — D489 Neoplasm of uncertain behavior, unspecified: Secondary | ICD-10-CM

## 2024-04-13 DIAGNOSIS — R222 Localized swelling, mass and lump, trunk: Secondary | ICD-10-CM

## 2024-04-14 ENCOUNTER — Inpatient Hospital Stay: Admission: RE | Admit: 2024-04-14 | Source: Ambulatory Visit

## 2024-04-14 ENCOUNTER — Encounter: Payer: Self-pay | Admitting: Internal Medicine

## 2024-04-17 NOTE — Progress Notes (Unsigned)
 Philip Cornet, MD  Baldwin Rosella L PROCEDURE / BIOPSY REVIEW Date: 04/15/24  Requested Biopsy site: Left supraclavicular node Reason for request: History of basal cell carcinoma Imaging review: US  study 04/10/24  Decision: Approved Imaging modality to perform: Ultrasound Schedule with: No sedation / Local anesthetic Schedule for: Any VIR  Additional comments:   Please contact me with questions, concerns, or if issue pertaining to this request arise.  Cornet JONELLE Philip, MD [facility]       Previous Messages    ----- Message ----- From: Baldwin Rosella CROME Sent: 04/13/2024  10:26 AM EDT To: Rosella CROME Baldwin; Taryn F Rigney, RT; Ir Proc* Subject: US  CORE BIOPSY (LYMPH NODES)                  Procedure :US  CORE BIOPSY (LYMPH NODES)  Reason :left neck mass looks like metastasis, Dx: Neoplasm of uncertain behavior   History :US  SOFT TISSUE HEAD & NECK (NON-THYROID ),  Provider: Clarice Nottingham, MD  Provider contact ; [phone]

## 2024-04-18 ENCOUNTER — Ambulatory Visit
Admission: RE | Admit: 2024-04-18 | Discharge: 2024-04-18 | Disposition: A | Discharge status: Home / Self Care | Source: Ambulatory Visit | Attending: Registered Nurse | Admitting: Registered Nurse

## 2024-04-18 ENCOUNTER — Ambulatory Visit
Admission: RE | Admit: 2024-04-18 | Discharge: 2024-04-18 | Disposition: A | Discharge status: Home / Self Care | Source: Ambulatory Visit | Attending: Internal Medicine | Admitting: Internal Medicine

## 2024-04-18 DIAGNOSIS — I7121 Aneurysm of the ascending aorta, without rupture: Secondary | ICD-10-CM | POA: Diagnosis not present

## 2024-04-18 DIAGNOSIS — R590 Localized enlarged lymph nodes: Secondary | ICD-10-CM | POA: Diagnosis not present

## 2024-04-18 DIAGNOSIS — K7689 Other specified diseases of liver: Secondary | ICD-10-CM | POA: Diagnosis not present

## 2024-04-18 DIAGNOSIS — J432 Centrilobular emphysema: Secondary | ICD-10-CM | POA: Diagnosis not present

## 2024-04-18 DIAGNOSIS — D489 Neoplasm of uncertain behavior, unspecified: Secondary | ICD-10-CM

## 2024-04-18 DIAGNOSIS — R222 Localized swelling, mass and lump, trunk: Secondary | ICD-10-CM

## 2024-04-18 MED ORDER — IOPAMIDOL (ISOVUE-300) INJECTION 61%
75.0000 mL | Freq: Once | INTRAVENOUS | Status: AC | PRN
  Administered 2024-04-18 (×2): 75 mL via INTRAVENOUS

## 2024-05-04 ENCOUNTER — Other Ambulatory Visit: Payer: Self-pay | Admitting: Cardiovascular Disease

## 2024-05-05 ENCOUNTER — Other Ambulatory Visit: Payer: Self-pay | Admitting: Student

## 2024-05-09 ENCOUNTER — Ambulatory Visit (HOSPITAL_COMMUNITY)

## 2024-05-17 ENCOUNTER — Encounter: Payer: Self-pay | Admitting: Cardiovascular Disease

## 2024-05-18 ENCOUNTER — Ambulatory Visit (HOSPITAL_COMMUNITY)
Admission: RE | Admit: 2024-05-18 | Discharge: 2024-05-18 | Disposition: A | Discharge status: Home / Self Care | Source: Ambulatory Visit | Attending: Internal Medicine | Admitting: Internal Medicine

## 2024-05-18 DIAGNOSIS — C4492 Squamous cell carcinoma of skin, unspecified: Secondary | ICD-10-CM | POA: Diagnosis not present

## 2024-05-18 DIAGNOSIS — R599 Enlarged lymph nodes, unspecified: Secondary | ICD-10-CM | POA: Diagnosis not present

## 2024-05-18 DIAGNOSIS — D489 Neoplasm of uncertain behavior, unspecified: Secondary | ICD-10-CM | POA: Diagnosis not present

## 2024-05-18 MED ORDER — LIDOCAINE HCL 1 % IJ SOLN
INTRAMUSCULAR | Status: AC
  Filled 2024-05-18: qty 20

## 2024-05-18 NOTE — Procedures (Signed)
Interventional Radiology Procedure Note  Procedure: US guided core biopsy of left supraclavicular LN  Complications: None  Estimated Blood Loss: None  Recommendations: -Path sent   Signed,  Criselda Peaches, MD

## 2024-05-22 LAB — SURGICAL PATHOLOGY

## 2024-05-23 LAB — SURGICAL PATHOLOGY

## 2024-05-24 ENCOUNTER — Encounter: Payer: Self-pay | Admitting: Emergency Medicine

## 2024-05-24 ENCOUNTER — Ambulatory Visit: Admitting: Emergency Medicine

## 2024-05-24 VITALS — BP 124/60 | HR 84 | Temp 98.0°F | Ht 68.0 in | Wt 245.8 lb

## 2024-05-24 DIAGNOSIS — J439 Emphysema, unspecified: Secondary | ICD-10-CM

## 2024-05-24 DIAGNOSIS — R918 Other nonspecific abnormal finding of lung field: Secondary | ICD-10-CM | POA: Diagnosis not present

## 2024-05-24 DIAGNOSIS — Z87891 Personal history of nicotine dependence: Secondary | ICD-10-CM

## 2024-05-24 NOTE — Patient Instructions (Signed)
  VISIT SUMMARY: You came in today for an evaluation of pulmonary nodules and a left supraclavicular lymph node. You discovered a lump in your neck after a tick bite, which led to further testing. A CT scan showed some small nodules in your lungs and a slightly enlarged lymph node in your neck. A biopsy of the lymph node did not show any signs of cancer. 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  YOUR PLAN: -BILATERAL PULMONARY NODULES UNDER SURVEILLANCE: Pulmonary nodules are small growths in the lungs. Your CT scan showed multiple tiny nodules, with the largest being 7 millimeters. These nodules can be benign, infectious, inflammatory, or less likely, malignant. Since you are not experiencing any symptoms, we will monitor these nodules with a repeat CT scan in 3 months to check for any changes.  -EMPHYSEMA: Emphysema is a lung condition that causes shortness of breath. It is often caused by smoking. Your CT scan showed mild emphysema, but you are not currently experiencing significant respiratory symptoms. If you develop symptoms in the future, we may consider starting you on inhaler therapy.  INSTRUCTIONS: Please schedule a repeat CT scan of your chest in 3 months to monitor the pulmonary nodules. We will review the results to check for any changes in size or appearance.

## 2024-05-24 NOTE — Progress Notes (Signed)
 Subjective:    Patient ID: Robert Dyer, male    DOB: 1961/03/23, 63 y.o.   MRN: [id number]  HPI Discussed the use of AI scribe software for clinical note transcription with the patient, who gave verbal consent to proceed.  History of Present Illness Robert Dyer is a 63 year old male with CAD, hypertension, and hyperlipidemia who presents for evaluation of pulmonary nodules and a left supraclavicular lymph node. He is accompanied by his family. He was referred for evaluation of pulmonary nodules and a left supraclavicular lymph node.  Approximately two weeks prior to his initial doctor's visit, he discovered a lump in his neck following a tick bite. The lump was not painful, and he had no prior reason to notice it before the tick bite. The patient reports that the swelling seemed to increase slightly after a biopsy was performed on May 18, 2024.  A CT scan of the neck on April 18, 2024, showed soft tissue thickening in the subcutaneous tissues of the posterior scalp on the left side, subcentimeter lymph nodes, and an irregular appearing left supraclavicular node measuring 11 millimeters. A biopsy of the left supraclavicular lymph node showed lymphoid tissue without clonal B or T cell populations, consistent with a reactive process.  A CT scan of the chest, abdomen, and pelvis on April 18, 2024, revealed mild atherosclerotic calcification of the aorta, no mediastinal adenopathy, some central alveolar and paraseptal emphysema, and multiple scattered tiny pulmonary nodules bilaterally. The most prominent nodule was 7 millimeters in the posterior right costophrenic sulcus, which was new compared to a prior CT in 2019. There was no evidence of metastatic disease in the chest, abdomen, or pelvis.  He has a history of smoking for approximately 40 years, with a peak of one and a half packs per day, but quit smoking about 8 years ago. He feels better since quitting smoking and does not  experience shortness of breath or significant coughing, except when waking up. He is not on any inhaler medications.  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 He has also had exposure to animals, including dogs, cows, and chickens, which could contribute to certain fungal exposures.    Results RADIOLOGY Neck Ultrasound: Left supraclavicular pathologic lymph node 1.8x0.8x1.3 cm with thickened cortex; adjacent smaller abnormal node (04/10/2024) Neck CT: Soft tissue thickening in subcutaneous tissues, posterior scalp on left side; subcentimeter lymph nodes; irregular left supraclavicular node 11 mm (04/18/2024) Chest, Abdomen, and Pelvis CT: Mild atherosclerotic calcification of aorta; no mediastinal or hilar adenopathy; central alveolar and paraseptal emphysema; multiple tiny pulmonary nodules bilaterally, largest 7 mm in posterior right costophrenic sulcus; cystic density in posterior upper pole of right kidney; no metastatic disease (04/18/2024)  PATHOLOGY Lymph Node Biopsy: Lymphoid tissue without clonal B or T cell populations; reactive process (05/18/2024)    Review of Systems As per HPI  Past Medical History:  Diagnosis Date   Coronary artery disease    Diverticulosis    GERD (gastroesophageal reflux disease)    Hyperlipidemia    Hypertension    MI, acute, non ST segment elevation (HCC)    s/p cutting balloon PTCA to the ramus intermedius   Tobacco abuse, in remission     Family History  Problem Relation Age of Onset   Heart attack Mother    Liver disease Father    Kidney disease Father  Social History   Socioeconomic History   Marital status: Married    Spouse name: Not on file   Number of children: Not on file   Years of education: Not on file   Highest education level: Not on file  Occupational History   Not on file  Tobacco  Use   Smoking status: Former    Current packs/day: 0.00    Types: Cigarettes    Quit date: 09/07/2016    Years since quitting: 7.7   Smokeless tobacco: Former  Substance and Sexual Activity   Alcohol use: Yes   Drug use: Yes    Types: Marijuana   Sexual activity: Not on file  Other Topics Concern   Not on file  Social History Narrative   Not on file   Social Drivers of Health   Financial Resource Strain: Not on file  Food Insecurity: Not on file  Transportation Needs: Not on file  Physical Activity: Not on file  Stress: Not on file  Social Connections: Not on file  Intimate Partner Violence: Not on file    Allergies  Allergen Reactions   Statins Rash    Current Outpatient Medications on File Prior to Visit  Medication Sig Dispense Refill   ALPRAZolam  (XANAX ) 0.25 MG tablet Take 0.25 mg by mouth 2 (two) times daily as needed for anxiety.     aspirin  81 MG tablet Take 81 mg by mouth daily.       escitalopram (LEXAPRO) 10 MG tablet Take 10 mg by mouth daily.     levothyroxine (SYNTHROID) 50 MCG tablet Take 50 mcg by mouth daily before breakfast.     liraglutide (VICTOZA) 18 MG/3ML SOPN Inject 1.8 mg into the skin daily.     lisinopril  (ZESTRIL ) 10 MG tablet TAKE 1 TABLET (10 MG TOTAL) BY MOUTH DAILY. PATIENT NEED ANNUAL APPOINTMENT FOR FUTURE REFILLS. 90 tablet 3   metFORMIN (GLUCOPHAGE) 500 MG tablet Take 500 mg by mouth daily.     metoprolol  tartrate (LOPRESSOR ) 25 MG tablet TAKE 1/2 TABLET TWICE A DAY BY MOUTH. PLEASE SCHEDULE YEARLY APPOINTMENT FOR FUTURE REFILLS. THANK YOU 90 tablet 0   nitroGLYCERIN  (NITROSTAT ) 0.4 MG SL tablet Place 1 tablet (0.4 mg total) under the tongue every 5 (five) minutes as needed for chest pain. 25 tablet 3   pantoprazole  (PROTONIX ) 40 MG tablet TAKE 1 TABLET BY MOUTH EVERY DAY 90 tablet 2   rosuvastatin  (CRESTOR ) 20 MG tablet TAKE 1 TABLET BY MOUTH EVERY DAY 90 tablet 2   liraglutide (VICTOZA) 18 MG/3ML SOPN Inject 1.8 mg into the skin daily.      metFORMIN (GLUCOPHAGE) 1000 MG tablet Take 1,000 mg by mouth 2 (two) times daily. (Patient not taking: Reported on 05/24/2024)     No current facility-administered medications on file prior to visit.       Objective:    Vitals:   05/24/24 0845 05/24/24 0848  BP: 124/60 124/60  Pulse: 84 84  Temp:  98 F (36.7 C)  TempSrc:  Oral  SpO2: 96%   Weight:  245 lb 12.8 oz (111.5 kg)  Height:  5' 8 (1.727 m)   Physical Exam Gen: Pleasant, obese man, in no distress,  normal affect  ENT: No lesions,  mouth clear,  oropharynx clear, no postnasal drip  Neck: No JVD, very subtle bruising at his left supraclavicular biopsy site with some supraclavicular fullness  Lungs: No use of accessory muscles, no crackles or wheezing on normal respiration, no wheeze on forced expiration  Cardiovascular: RRR, heart sounds normal, no murmur or gallops, no peripheral edema  Musculoskeletal: No deformities, no cyanosis or clubbing  Neuro: alert, awake, non focal  Skin: Warm, no lesions or rashes       Assessment & Plan:   Assessment & Plan Pulmonary nodules/lesions, multiple   Assessment and Plan Assessment & Plan Bilateral pulmonary nodules under surveillance Multiple new tiny pulmonary nodules bilaterally, with a 7 mm nodule in the posterior right costophrenic sulcus. Challenging biopsy location. Recent negative biopsy of left supraclavicular lymph node is reassuring.  Unclear whether the pulmonary nodules relate to the lymphatic process especially since the supraclavicular node in question was near his tick bite. Differential includes benign, infectious, inflammatory, and less likely malignant causes. Asymptomatic, supporting conservative management. - Order repeat CT chest in 3 months to monitor nodule size and characteristics. - Review CT results for changes in nodule size or appearance. - Bronchoscopy for biopsy if suspicion for malignancy increases based on follow-up  imaging  Emphysema Mild emphysema on CT, likely from previous smoking. Breathing improved since quitting smoking. No significant respiratory symptoms reported. - Consider inhaler therapy if symptoms develop.     Return in about 2 months (around 08/03/2024) for With Dr. Shelah, To review CT scan of the chest.  Lamar Shelah, MD, PhD 05/24/2024, 9:30 AM [facility] [phone] or if no answer before 7:00PM call [phone] For any issues after 7:00PM please call eLink [phone]

## 2024-05-26 DIAGNOSIS — R599 Enlarged lymph nodes, unspecified: Secondary | ICD-10-CM | POA: Diagnosis not present

## 2024-06-01 ENCOUNTER — Other Ambulatory Visit: Payer: Self-pay | Admitting: Cardiovascular Disease

## 2024-06-02 ENCOUNTER — Other Ambulatory Visit: Payer: Self-pay | Admitting: Cardiovascular Disease

## 2024-06-09 ENCOUNTER — Encounter: Payer: Self-pay | Admitting: Cardiovascular Disease

## 2024-06-09 ENCOUNTER — Ambulatory Visit: Attending: Cardiovascular Disease | Admitting: Cardiovascular Disease

## 2024-06-09 VITALS — BP 104/70 | HR 85 | Ht 68.0 in | Wt 250.0 lb

## 2024-06-09 DIAGNOSIS — I519 Heart disease, unspecified: Secondary | ICD-10-CM | POA: Diagnosis not present

## 2024-06-09 DIAGNOSIS — I77810 Thoracic aortic ectasia: Secondary | ICD-10-CM | POA: Diagnosis not present

## 2024-06-09 DIAGNOSIS — I2510 Atherosclerotic heart disease of native coronary artery without angina pectoris: Secondary | ICD-10-CM | POA: Diagnosis not present

## 2024-06-09 DIAGNOSIS — I10 Essential (primary) hypertension: Secondary | ICD-10-CM

## 2024-06-09 DIAGNOSIS — I700 Atherosclerosis of aorta: Secondary | ICD-10-CM | POA: Diagnosis not present

## 2024-06-09 MED ORDER — METOPROLOL TARTRATE 25 MG PO TABS: 25.0000 mg | ORAL_TABLET | Freq: Two times a day (BID) | ORAL | 3 refills | Status: AC

## 2024-06-09 NOTE — Progress Notes (Signed)
 Cardiology Office Note    Date:  06/10/2024   ID:  Robert Dyer, DOB 1961-08-03, MRN [id number]  PCP:  Clarice Nottingham, MD  Cardiologist:  Jerel Balding, MD   No chief complaint on file.    History of Present Illness:  Robert Dyer is a 63 y.o. male with a history of coronary artery disease and non-STEMI in May 2012 treated with cutting balloon angioplasty of the ramus intermedius artery, mildly reduced LVEF (EF 45-50%), hyperlipidemia, hypertension, obesity, ongoing tobacco abuse, anxiety disorder and GERD, previously  [name] patient.  He generally feels well.  He has retired and has taken on most of the household responsibilities after his wife has had to undergo surgery and chemotherapy for breast cancer.  He has not started smoking again.  Although he has lost 30 pounds in the last year, he remains severely obese with a BMI of 38.  His blood pressure is under excellent control and he has an excellent LDL cholesterol of 40 on statin.  As expected, HDL cholesterol remains very low at 26.  He does have diabetes mellitus, but takes Victoza and a low-dose of metformin with excellent control in fact his hemoglobin A1c has dropped from 6.0% to only 5.1% and his triglycerides are now in fully normal range.  The patient specifically denies any chest pain at rest or with exertion, dyspnea at rest or with exertion, orthopnea, paroxysmal nocturnal dyspnea, syncope, palpitations, focal neurological deficits, intermittent claudication, lower extremity edema, unexplained weight gain, cough, hemoptysis or wheezing.     Past Medical History:  Diagnosis Date   Coronary artery disease    Diverticulosis    GERD (gastroesophageal reflux disease)    Hyperlipidemia    Hypertension    MI, acute, non ST segment elevation (HCC)    s/p cutting balloon PTCA to the ramus intermedius   Tobacco abuse, in remission     Past Surgical History:  Procedure Laterality Date   CARDIAC  CATHETERIZATION  02/03/2011   revealing a total occlusion of the mid first diagonal with a 99% ostial stenosis of the ramus intermedius, and otherwise nonobstructive  disease.  EF was 45% with distal anterolateral akinesis.  The first  diagonal was felt to be the infarct vessel that was felt that it was  likely occluded for some time and was not likely to benefit with  reperfusion.     COLONOSCOPY  2012   Dr. Rollin   CORONARY ANGIOPLASTY WITH STENT PLACEMENT  02/03/2011   s/p cutting balloon PTCA to the ramus intermediate   HEMORROIDECTOMY      Current Medications: Outpatient Medications Prior to Visit  Medication Sig Dispense Refill   ALPRAZolam  (XANAX ) 0.25 MG tablet Take 0.25 mg by mouth 2 (two) times daily as needed for anxiety.     aspirin  81 MG tablet Take 81 mg by mouth daily.       escitalopram (LEXAPRO) 10 MG tablet Take 10 mg by mouth daily.     levothyroxine (SYNTHROID) 50 MCG tablet Take 50 mcg by mouth daily before breakfast.     liraglutide (VICTOZA) 18 MG/3ML SOPN Inject 1.8 mg into the skin daily.     lisinopril  (ZESTRIL ) 10 MG tablet TAKE 1 TABLET (10 MG TOTAL) BY MOUTH DAILY. PATIENT NEED ANNUAL APPOINTMENT FOR FUTURE REFILLS. 90 tablet 3   metFORMIN (GLUCOPHAGE) 500 MG tablet Take 500 mg by mouth daily.     pantoprazole  (PROTONIX ) 40 MG tablet Take 1 tablet (40 mg total)  by mouth daily. NEED OV. 90 tablet 0   rosuvastatin  (CRESTOR ) 20 MG tablet TAKE 1 TABLET BY MOUTH EVERY DAY 90 tablet 2   metoprolol  tartrate (LOPRESSOR ) 25 MG tablet TAKE 1/2 TABLET TWICE A DAY BY MOUTH. PLEASE SCHEDULE YEARLY APPOINTMENT FOR FUTURE REFILLS. THANK YOU (Patient taking differently: Take 25 mg by mouth 2 (two) times daily. TAKE 1/2 TABLET TWICE A DAY BY MOUTH. PLEASE SCHEDULE YEARLY APPOINTMENT FOR FUTURE REFILLS. THANK YOU) 30 tablet 0   liraglutide (VICTOZA) 18 MG/3ML SOPN Inject 1.8 mg into the skin daily.     metFORMIN (GLUCOPHAGE) 1000 MG tablet Take 1,000 mg by mouth 2 (two) times daily.  (Patient not taking: Reported on 05/24/2024)     nitroGLYCERIN  (NITROSTAT ) 0.4 MG SL tablet Place 1 tablet (0.4 mg total) under the tongue every 5 (five) minutes as needed for chest pain. (Patient not taking: Reported on 06/09/2024) 25 tablet 3   No facility-administered medications prior to visit.     Allergies:   Ezetimibe-simvastatin and Statins   Social History   Socioeconomic History   Marital status: Married    Spouse name: Not on file   Number of children: Not on file   Years of education: Not on file   Highest education level: Not on file  Occupational History   Not on file  Tobacco Use   Smoking status: Former    Current packs/day: 0.00    Types: Cigarettes    Quit date: 09/07/2016    Years since quitting: 7.7   Smokeless tobacco: Former  Substance and Sexual Activity   Alcohol use: Yes   Drug use: Yes    Types: Marijuana   Sexual activity: Not on file  Other Topics Concern   Not on file  Social History Narrative   Not on file   Social Drivers of Health   Financial Resource Strain: Not on file  Food Insecurity: Not on file  Transportation Needs: Not on file  Physical Activity: Not on file  Stress: Not on file  Social Connections: Not on file     Family History:  The patient's family history includes Heart attack in his mother; Kidney disease in his father; Liver disease in his father.   ROS:   Please see the history of present illness.    ROS All other systems reviewed and are negative.   PHYSICAL EXAM:   VS:  BP 104/70 (BP Location: Left Arm, Patient Position: Sitting, Cuff Size: Large)   Pulse 85   Ht 5' 8 (1.727 m)   Wt 250 lb (113.4 kg)   SpO2 98%   BMI 38.01 kg/m      General: Alert, oriented x3, no distress, severely obese Head: no evidence of trauma, PERRL, EOMI, no exophtalmos or lid lag, no myxedema, no xanthelasma; normal ears, nose and oropharynx Neck: normal jugular venous pulsations and no hepatojugular reflux; brisk carotid pulses  without delay and no carotid bruits Chest: clear to auscultation, no signs of consolidation by percussion or palpation, normal fremitus, symmetrical and full respiratory excursions Cardiovascular: normal position and quality of the apical impulse, regular rhythm, normal first and second heart sounds, no murmurs, rubs or gallops Abdomen: no tenderness or distention, no masses by palpation, no abnormal pulsatility or arterial bruits, normal bowel sounds, no hepatosplenomegaly Extremities: no clubbing, cyanosis or edema; 2+ radial, ulnar and brachial pulses bilaterally; 2+ right femoral, posterior tibial and dorsalis pedis pulses; 2+ left femoral, posterior tibial and dorsalis pedis pulses; no subclavian or femoral  bruits Neurological: grossly nonfocal Psych: Normal mood and affect     Wt Readings from Last 3 Encounters:  06/09/24 250 lb (113.4 kg)  05/24/24 245 lb 12.8 oz (111.5 kg)  05/18/23 279 lb (126.6 kg)      Studies/Labs Reviewed:   EKG: Personally reviewed his most recent ECG from 05/18/2023 showing normal sinus rhythm, normal tracing  EKG Interpretation Date/Time:    Ventricular Rate:    PR Interval:    QRS Duration:    QT Interval:    QTC Calculation:   R Axis:      Text Interpretation:           Recent Labs: No results found for requested labs within last 365 days.  02/01/2024 Cholesterol 92, HDL 26, LDL 40, triglycerides 147 Hemoglobin 14.7  03/20/2023 Creatinine 1.26, potassium 4.6, normal liver function tests  04/05/2024  hemoglobin A1c 5.1%  Lipid Panel    Component Value Date/Time   CHOL 110 11/08/2017 0924   TRIG 87 11/08/2017 0924   HDL 26 (L) 11/08/2017 0924   CHOLHDL 4.2 11/08/2017 0924   CHOLHDL 4.6 11/05/2016 0947   VLDL 28 11/05/2016 0947   LDLCALC 67 11/08/2017 0924     ASSESSMENT:    1. Coronary artery disease involving native coronary artery of native heart without angina pectoris   2. Left ventricular systolic dysfunction  without heart failure   3. Essential hypertension   4. LV dysfunction   5. Severe obesity (BMI 35.0-39.9) with comorbidity (HCC)   6. Aortic atherosclerosis   7. Mild dilation of ascending aorta       PLAN:  In order of problems listed above:  CAD: Slightly physically active he is asymptomatic, no angina.  On aspirin , statin, beta-blocker. LV systolic dysfunction: A remote echocardiogram performed in 2014 showed mildly depressed LVEF at 45-50% but has never had clinical heart failure.  He is on ACE inhibitors and beta-blockers.  He does not require diuretics. HTN: Excellent control.  In fact with weight loss his blood pressure is now on the lower end of normal.  He does not have symptoms of hypotension.  Continue same medications. HLP: Excellent LDL cholesterol and triglyceride level.  HDL is chronically low, likely inherited.  Has not really budged even with substantial weight loss. Obesity: He has made good progress with weight loss, but remains severely obese.  Reviewed healthy dietary changes, increasing the intake of unsaturated fats from fish, nuts, oils, avocado, and lean protein from fish and poultry, reducing intake of saturated fat and sweets and starches with high glycemic index. Aortic atherosclerosis: Ascending aorta was measured 4 cm in diameter (incidentally seen on a CT of the abdomen pelvis in August).  Will reevaluate periodically.    Medication Adjustments/Labs and Tests Ordered: Current medicines are reviewed at length with the patient today.  Concerns regarding medicines are outlined above.  Medication changes, Labs and Tests ordered today are listed in the Patient Instructions below. Patient Instructions  Medication Instructions:  No changes *If you need a refill on your cardiac medications before your next appointment, please call your pharmacy*  Lab Work: None ordered If you have labs (blood work) drawn today and your tests are completely normal, you will  receive your results only by: MyChart Message (if you have MyChart) OR A paper copy in the mail If you have any lab test that is abnormal or we need to change your treatment, we will call you to review the results.  Testing/Procedures: None ordered  Follow-Up: At [facility], you and your health needs are our priority.  As part of our continuing mission to provide you with exceptional heart care, our providers are all part of one team.  This team includes your primary Cardiologist (physician) and Advanced Practice Providers or APPs (Physician Assistants and Nurse Practitioners) who all work together to provide you with the care you need, when you need it.  Your next appointment:   1 year(s)  Provider:   Jerel Balding, MD    We recommend signing up for the patient portal called MyChart.  Sign up information is provided on this After Visit Summary.  MyChart is used to connect with patients for Virtual Visits (Telemedicine).  Patients are able to view lab/test results, encounter notes, upcoming appointments, etc.  Non-urgent messages can be sent to your provider as well.   To learn more about what you can do with MyChart, go to [email or web address].      Signed, Jerel Balding, MD  06/10/2024 7:37 PM    [facility] [address] [place], [place], [place]  [address] Phone: [phone]; Fax: [phone]

## 2024-06-09 NOTE — Patient Instructions (Signed)

## 2024-06-21 DIAGNOSIS — E7800 Pure hypercholesterolemia, unspecified: Secondary | ICD-10-CM | POA: Diagnosis not present

## 2024-06-21 DIAGNOSIS — I10 Essential (primary) hypertension: Secondary | ICD-10-CM | POA: Diagnosis not present

## 2024-06-21 DIAGNOSIS — E1151 Type 2 diabetes mellitus with diabetic peripheral angiopathy without gangrene: Secondary | ICD-10-CM | POA: Diagnosis not present

## 2024-06-21 DIAGNOSIS — C4491 Basal cell carcinoma of skin, unspecified: Secondary | ICD-10-CM | POA: Diagnosis not present

## 2024-06-21 DIAGNOSIS — I252 Old myocardial infarction: Secondary | ICD-10-CM | POA: Diagnosis not present

## 2024-06-23 DIAGNOSIS — Z23 Encounter for immunization: Secondary | ICD-10-CM | POA: Diagnosis not present

## 2024-07-24 ENCOUNTER — Other Ambulatory Visit

## 2024-07-26 ENCOUNTER — Ambulatory Visit: Admitting: Emergency Medicine

## 2024-07-31 DIAGNOSIS — E1151 Type 2 diabetes mellitus with diabetic peripheral angiopathy without gangrene: Secondary | ICD-10-CM | POA: Diagnosis not present

## 2024-07-31 DIAGNOSIS — E039 Hypothyroidism, unspecified: Secondary | ICD-10-CM | POA: Diagnosis not present

## 2024-07-31 LAB — LAB REPORT - SCANNED
A1c: 5.5
EGFR: 65
TSH: 1.44 (ref 0.41–5.90)

## 2024-08-09 DIAGNOSIS — E039 Hypothyroidism, unspecified: Secondary | ICD-10-CM | POA: Diagnosis not present

## 2024-08-09 DIAGNOSIS — E7800 Pure hypercholesterolemia, unspecified: Secondary | ICD-10-CM | POA: Diagnosis not present

## 2024-08-09 DIAGNOSIS — I252 Old myocardial infarction: Secondary | ICD-10-CM | POA: Diagnosis not present

## 2024-08-09 DIAGNOSIS — I25111 Atherosclerotic heart disease of native coronary artery with angina pectoris with documented spasm: Secondary | ICD-10-CM | POA: Diagnosis not present

## 2024-08-09 DIAGNOSIS — I10 Essential (primary) hypertension: Secondary | ICD-10-CM | POA: Diagnosis not present

## 2024-08-09 DIAGNOSIS — E1151 Type 2 diabetes mellitus with diabetic peripheral angiopathy without gangrene: Secondary | ICD-10-CM | POA: Diagnosis not present

## 2024-09-30 ENCOUNTER — Other Ambulatory Visit: Payer: Self-pay | Admitting: Cardiovascular Disease
